# Patient Record
Sex: Female | Born: 1935 | Race: White | Hispanic: No | Smoking: Never smoker
Health system: Southern US, Community
[De-identification: ages and names within clinical notes are randomized; demographics above are authoritative.]

## PROBLEM LIST (undated history)

## (undated) DIAGNOSIS — I471 Supraventricular tachycardia, unspecified: Secondary | ICD-10-CM

## (undated) DIAGNOSIS — I739 Peripheral vascular disease, unspecified: Secondary | ICD-10-CM

## (undated) DIAGNOSIS — I480 Paroxysmal atrial fibrillation: Secondary | ICD-10-CM

## (undated) DIAGNOSIS — E039 Hypothyroidism, unspecified: Secondary | ICD-10-CM

## (undated) DIAGNOSIS — F419 Anxiety disorder, unspecified: Secondary | ICD-10-CM

## (undated) DIAGNOSIS — I2510 Atherosclerotic heart disease of native coronary artery without angina pectoris: Secondary | ICD-10-CM

## (undated) DIAGNOSIS — M1990 Unspecified osteoarthritis, unspecified site: Secondary | ICD-10-CM

## (undated) DIAGNOSIS — I5032 Chronic diastolic (congestive) heart failure: Secondary | ICD-10-CM

## (undated) DIAGNOSIS — K589 Irritable bowel syndrome without diarrhea: Secondary | ICD-10-CM

## (undated) DIAGNOSIS — I491 Atrial premature depolarization: Secondary | ICD-10-CM

## (undated) DIAGNOSIS — I495 Sick sinus syndrome: Secondary | ICD-10-CM

## (undated) DIAGNOSIS — I10 Essential (primary) hypertension: Secondary | ICD-10-CM

## (undated) DIAGNOSIS — E785 Hyperlipidemia, unspecified: Secondary | ICD-10-CM

## (undated) HISTORY — PX: CORONARY ANGIOPLASTY WITH STENT PLACEMENT: SHX49

## (undated) HISTORY — PX: BACK SURGERY: SHX140

## (undated) HISTORY — PX: CHOLECYSTECTOMY: SHX55

## (undated) HISTORY — PX: ABDOMINAL HYSTERECTOMY: SHX81

## (undated) HISTORY — PX: THYROIDECTOMY: SHX17

## (undated) HISTORY — PX: LUMBAR DISC SURGERY: SHX700

## (undated) HISTORY — DX: Paroxysmal atrial fibrillation: I48.0

## (undated) HISTORY — PX: ANTERIOR FUSION CERVICAL SPINE: SUR626

## (undated) HISTORY — DX: Sick sinus syndrome: I49.5

---

## 2016-01-10 DIAGNOSIS — I2510 Atherosclerotic heart disease of native coronary artery without angina pectoris: Secondary | ICD-10-CM | POA: Insufficient documentation

## 2016-01-10 DIAGNOSIS — I493 Ventricular premature depolarization: Secondary | ICD-10-CM | POA: Insufficient documentation

## 2016-01-22 ENCOUNTER — Emergency Department
Admission: EM | Admit: 2016-01-22 | Discharge: 2016-01-23 | Disposition: A | Discharge status: Home / Self Care | Payer: Medicare PPO | Attending: Emergency Medicine | Admitting: Emergency Medicine

## 2016-01-22 ENCOUNTER — Emergency Department: Payer: Medicare PPO

## 2016-01-22 ENCOUNTER — Encounter: Payer: Self-pay | Admitting: Emergency Medicine

## 2016-01-22 DIAGNOSIS — E039 Hypothyroidism, unspecified: Secondary | ICD-10-CM | POA: Diagnosis not present

## 2016-01-22 DIAGNOSIS — I10 Essential (primary) hypertension: Secondary | ICD-10-CM | POA: Insufficient documentation

## 2016-01-22 DIAGNOSIS — I2510 Atherosclerotic heart disease of native coronary artery without angina pectoris: Secondary | ICD-10-CM | POA: Diagnosis not present

## 2016-01-22 DIAGNOSIS — R002 Palpitations: Secondary | ICD-10-CM | POA: Diagnosis not present

## 2016-01-22 DIAGNOSIS — R0789 Other chest pain: Secondary | ICD-10-CM

## 2016-01-22 HISTORY — DX: Supraventricular tachycardia, unspecified: I47.10

## 2016-01-22 HISTORY — DX: Supraventricular tachycardia: I47.1

## 2016-01-22 HISTORY — DX: Atherosclerotic heart disease of native coronary artery without angina pectoris: I25.10

## 2016-01-22 HISTORY — DX: Peripheral vascular disease, unspecified: I73.9

## 2016-01-22 HISTORY — DX: Unspecified osteoarthritis, unspecified site: M19.90

## 2016-01-22 HISTORY — DX: Hypothyroidism, unspecified: E03.9

## 2016-01-22 HISTORY — DX: Irritable bowel syndrome, unspecified: K58.9

## 2016-01-22 HISTORY — DX: Essential (primary) hypertension: I10

## 2016-01-22 HISTORY — DX: Hyperlipidemia, unspecified: E78.5

## 2016-01-22 LAB — CBC WITH DIFFERENTIAL/PLATELET
Basophils Absolute: 0 10*3/uL (ref 0–0.1)
Basophils Relative: 0 %
EOS PCT: 2 %
Eosinophils Absolute: 0.1 10*3/uL (ref 0–0.7)
HEMATOCRIT: 37 % (ref 35.0–47.0)
Hemoglobin: 12.6 g/dL (ref 12.0–16.0)
LYMPHS ABS: 1.6 10*3/uL (ref 1.0–3.6)
LYMPHS PCT: 28 %
MCH: 30.4 pg (ref 26.0–34.0)
MCHC: 34.1 g/dL (ref 32.0–36.0)
MCV: 89.2 fL (ref 80.0–100.0)
MONO ABS: 0.5 10*3/uL (ref 0.2–0.9)
Monocytes Relative: 9 %
NEUTROS ABS: 3.5 10*3/uL (ref 1.4–6.5)
Neutrophils Relative %: 61 %
PLATELETS: 211 10*3/uL (ref 150–440)
RBC: 4.15 MIL/uL (ref 3.80–5.20)
RDW: 13.6 % (ref 11.5–14.5)
WBC: 5.7 10*3/uL (ref 3.6–11.0)

## 2016-01-22 LAB — COMPREHENSIVE METABOLIC PANEL
ALT: 11 U/L — AB (ref 14–54)
AST: 16 U/L (ref 15–41)
Albumin: 3.9 g/dL (ref 3.5–5.0)
Alkaline Phosphatase: 94 U/L (ref 38–126)
Anion gap: 6 (ref 5–15)
BILIRUBIN TOTAL: 0.5 mg/dL (ref 0.3–1.2)
BUN: 15 mg/dL (ref 6–20)
CHLORIDE: 110 mmol/L (ref 101–111)
CO2: 24 mmol/L (ref 22–32)
CREATININE: 0.88 mg/dL (ref 0.44–1.00)
Calcium: 8.7 mg/dL — ABNORMAL LOW (ref 8.9–10.3)
Glucose, Bld: 203 mg/dL — ABNORMAL HIGH (ref 65–99)
POTASSIUM: 3.5 mmol/L (ref 3.5–5.1)
Sodium: 140 mmol/L (ref 135–145)
TOTAL PROTEIN: 6.6 g/dL (ref 6.5–8.1)

## 2016-01-22 LAB — TROPONIN I: Troponin I: <0.03 ng/mL (ref <0.03)

## 2016-01-22 LAB — MAGNESIUM: MAGNESIUM: 1.9 mg/dL (ref 1.7–2.4)

## 2016-01-22 NOTE — ED Notes (Signed)
Patient @ XR 

## 2016-01-22 NOTE — ED Notes (Signed)
Changed out patient's O2 sensor, it was giving false data.

## 2016-01-22 NOTE — ED Provider Notes (Addendum)
[facility] Emergency Department Provider Note  ____________________________________________   First MD Initiated Contact with Patient 01/22/16 1939     (approximate)  I have reviewed the triage vital signs and the nursing notes.   HISTORY  Chief Complaint Chest Pain    HPI Elson AreasMary L Harari is a 82 y.o. female who is from [place] and was seen 12 days ago by her cardiologist, Dr. Ronny FlurryGery Tomassoni, and started on sotalol for intermittent symptomatic SVT/palpitations.  She is now visiting [place] through the end of the year and staying with her daughter.  She had an episode tonight that feels similar to the episode she has been having for 5-6 months and include acute onset fluttering in her chest, lightheadedness, dizziness, nausea, and vomiting.  Symptoms are severe when they first happened and then resolved.  She states that there is no difference between this one and her symptoms over the last 5-6 months.  She reports that they had spoken by phone with her cardiologist office yesterday who requested that the patient obtain an EKG and have it faxed to him.  They were planning to come to this hospital to try to have an EKG performed as an outpatient when she had her acute episode while at dinner so they came in and checked into the emergency department.  She currently feels better and has no active symptoms at this time.  She reports that she had a cardiac catheterization about a month ago which did not require any stents or intervention although she has 2 stents from prior catheterizations.  She takes Plavix every day and did have her dose today.  She denies recent illness, abdominal pain, and any active chest pain.  Nothing in particular makes her symptoms better or worse.   Past Medical History:  Diagnosis Date  . CAD (coronary artery disease)    stents to proximal LAD and mid RCA  . Hyperlipidemia   . Hypertension   . Hypothyroidism   . IBS (irritable bowel  syndrome)   . Osteoarthritis   . PVD (peripheral vascular disease) (HCC)   . SVT (supraventricular tachycardia) (HCC)     There are no active problems to display for this patient.   Past Surgical History:  Procedure Laterality Date  . ABDOMINAL HYSTERECTOMY    . BACK SURGERY    . CHOLECYSTECTOMY    . THYROIDECTOMY Left     Prior to Admission medications   Not on File    Allergies Penicillins and Sulfa antibiotics  History reviewed. No pertinent family history.  Social History Social History  Substance Use Topics  . Smoking status: Never Smoker  . Smokeless tobacco: Never Used  . Alcohol use No    Review of Systems Constitutional: No fever/chills Eyes: No visual changes. ENT: No sore throat. Cardiovascular: Episodic palpitations and chest discomfort Respiratory: Denies shortness of breath. Gastrointestinal: No abdominal pain.  No nausea, no vomiting.  No diarrhea.  No constipation. Genitourinary: Negative for dysuria. Musculoskeletal: Negative for back pain. Skin: Negative for rash. Neurological: Negative for headaches, focal weakness or numbness.  10-point ROS otherwise negative.  ____________________________________________   PHYSICAL EXAM:  VITAL SIGNS: ED Triage Vitals  Enc Vitals Group     BP 01/22/16 1902 (!) 178/78     Pulse Rate 01/22/16 1902 (!) 54     Resp 01/22/16 1902 16     Temp 01/22/16 1902 97.3 F (36.3 C)     Temp Source 01/22/16 1902 Oral  SpO2 01/22/16 1902 97 %     Weight 01/22/16 1901 158 lb (71.7 kg)     Height 01/22/16 1901 5\' 4"  (1.626 m)     Head Circumference --      Peak Flow --      Pain Score 01/22/16 1912 8     Pain Loc --      Pain Edu? --      Excl. in GC? --     Constitutional: Alert and oriented. Well appearing and in no acute distress. Eyes: Conjunctivae are normal. PERRL. EOMI. Head: Atraumatic. Nose: No congestion/rhinnorhea. Mouth/Throat: Mucous membranes are moist.  Oropharynx  non-erythematous. Neck: No stridor.  No meningeal signs.   Cardiovascular: Bradycardia, regular rhythm. Good peripheral circulation. Grossly normal heart sounds. Respiratory: Normal respiratory effort.  No retractions. Lungs CTAB. Gastrointestinal: Soft and nontender. No distention.  Musculoskeletal: No lower extremity tenderness nor edema. No gross deformities of extremities. Neurologic:  Normal speech and language. No gross focal neurologic deficits are appreciated.  Skin:  Skin is warm, dry and intact. No rash noted. Psychiatric: Mood and affect are normal. Speech and behavior are normal.  ____________________________________________   LABS (all labs ordered are listed, but only abnormal results are displayed)  Labs Reviewed  COMPREHENSIVE METABOLIC PANEL - Abnormal; Notable for the following:       Result Value   Glucose, Bld 203 (*)    Calcium 8.7 (*)    ALT 11 (*)    All other components within normal limits  CBC WITH DIFFERENTIAL/PLATELET  TROPONIN I  MAGNESIUM  TROPONIN I   ____________________________________________  EKG  ED ECG REPORT #1 I, Jozsef Wescoat, the attending physician, personally viewed and interpreted this ECG.  Date: 01/22/2016 EKG Time: 18:59 Rate: 52 Rhythm: Sinus bradycardia QRS Axis: normal Intervals: normal ST/T Wave abnormalities: Non-specific ST segment / T-wave changes, but no evidence of acute ischemia. Conduction Disturbances: none Narrative Interpretation: unremarkable  ED ECG REPORT #2 I, Corrissa Martello, the attending physician, personally viewed and interpreted this ECG.  Date: 01/22/2016 EKG Time: 20:56 Rate: 52 Rhythm: sinus bradycardia QRS Axis: normal Intervals: normal ST/T Wave abnormalities: Inverted T waves in leads V2 and V3, no reciprocal changes, no ST segment elevation or depression Conduction Disturbances: none Narrative Interpretation:  unremarkable  ____________________________________________  RADIOLOGY   Dg Chest 2 View  Result Date: 01/22/2016 CLINICAL DATA:  Chest pain EXAM: CHEST  2 VIEW COMPARISON:  None. FINDINGS: The heart is moderately enlarged. Linear atelectasis for scar in the right middle lobe and lingula. Normal vascularity. Moderate size hiatal hernia contains an air-fluid level. Increased AP diameter of the chest and interstitial prominence are likely related to COPD. No pneumothorax or pleural effusion. IMPRESSION: No active cardiopulmonary disease.  Chronic changes. Electronically Signed   By: Jolaine Click M.D.   On: 01/22/2016 19:44    ____________________________________________   PROCEDURES  Procedure(s) performed:   Procedures   Critical Care performed: No ____________________________________________   INITIAL IMPRESSION / ASSESSMENT AND PLAN / ED COURSE  Pertinent labs & imaging results that were available during my care of the patient were reviewed by me and considered in my medical decision making (see chart for details).  The patient [place] cardiology [place] of [place] but she will not be returning to [place] for more than a month.  However she is in close contact with her cardiologist.  As per his request I faxed him a copy of her EKG.  I discussed the plan with the  patient and her daughter and our current plan is to check 2 sets of enzymes and discharge for outpatient follow-up if she remains stable and asymptomatic.  Given that she has had a catheterization within the last month which was reassuring, she recently started on sotalol, and she is in telephone contact with her cardiologist, I feel this is safe and acceptable and she can come back to the emergency department with new or worsening symptoms.  She and her daughter agree with this plan.  Transferring ED care to Dr. Sharma CovertNorman to follow up repeat troponin and  reassess.   ____________________________________________  FINAL CLINICAL IMPRESSION(S) / ED DIAGNOSES  Final diagnoses:  Palpitations  Atypical chest pain     MEDICATIONS GIVEN DURING THIS VISIT:  Medications - No data to display   NEW OUTPATIENT MEDICATIONS STARTED DURING THIS VISIT:  New Prescriptions   No medications on file    Modified Medications   No medications on file    Discontinued Medications   No medications on file     Note:  This document was prepared using Dragon voice recognition software and may include unintentional dictation errors.    Loleta Roseory Assia Meanor, MD 01/22/16 2056    Loleta Roseory Kimyata Milich, MD 01/22/16 [id number]    Loleta Roseory Sabine Tenenbaum, MD 01/22/16 2106

## 2016-01-22 NOTE — ED Notes (Signed)
ED Provider at bedside. 

## 2016-01-22 NOTE — ED Triage Notes (Signed)
Pt to triage via w/c with no distress noted; reports mid CP, nonradiating accomp by weakness; st onset while eating dinner; hx of same

## 2016-01-22 NOTE — Discharge Instructions (Signed)

## 2016-10-25 HISTORY — PX: OTHER SURGICAL HISTORY: SHX169

## 2016-11-06 HISTORY — PX: OTHER SURGICAL HISTORY: SHX169

## 2018-01-17 IMAGING — CR DG CHEST 2V
1 series · 2 of 2 positions shown · non-contrast
Comparison: None.

CLINICAL DATA: Chest pain

EXAM:
CHEST  2 VIEW

[Series 1: dg chest 2 view · 0.14mm/px · 2 of 2 slices shown]
[im 1/2]
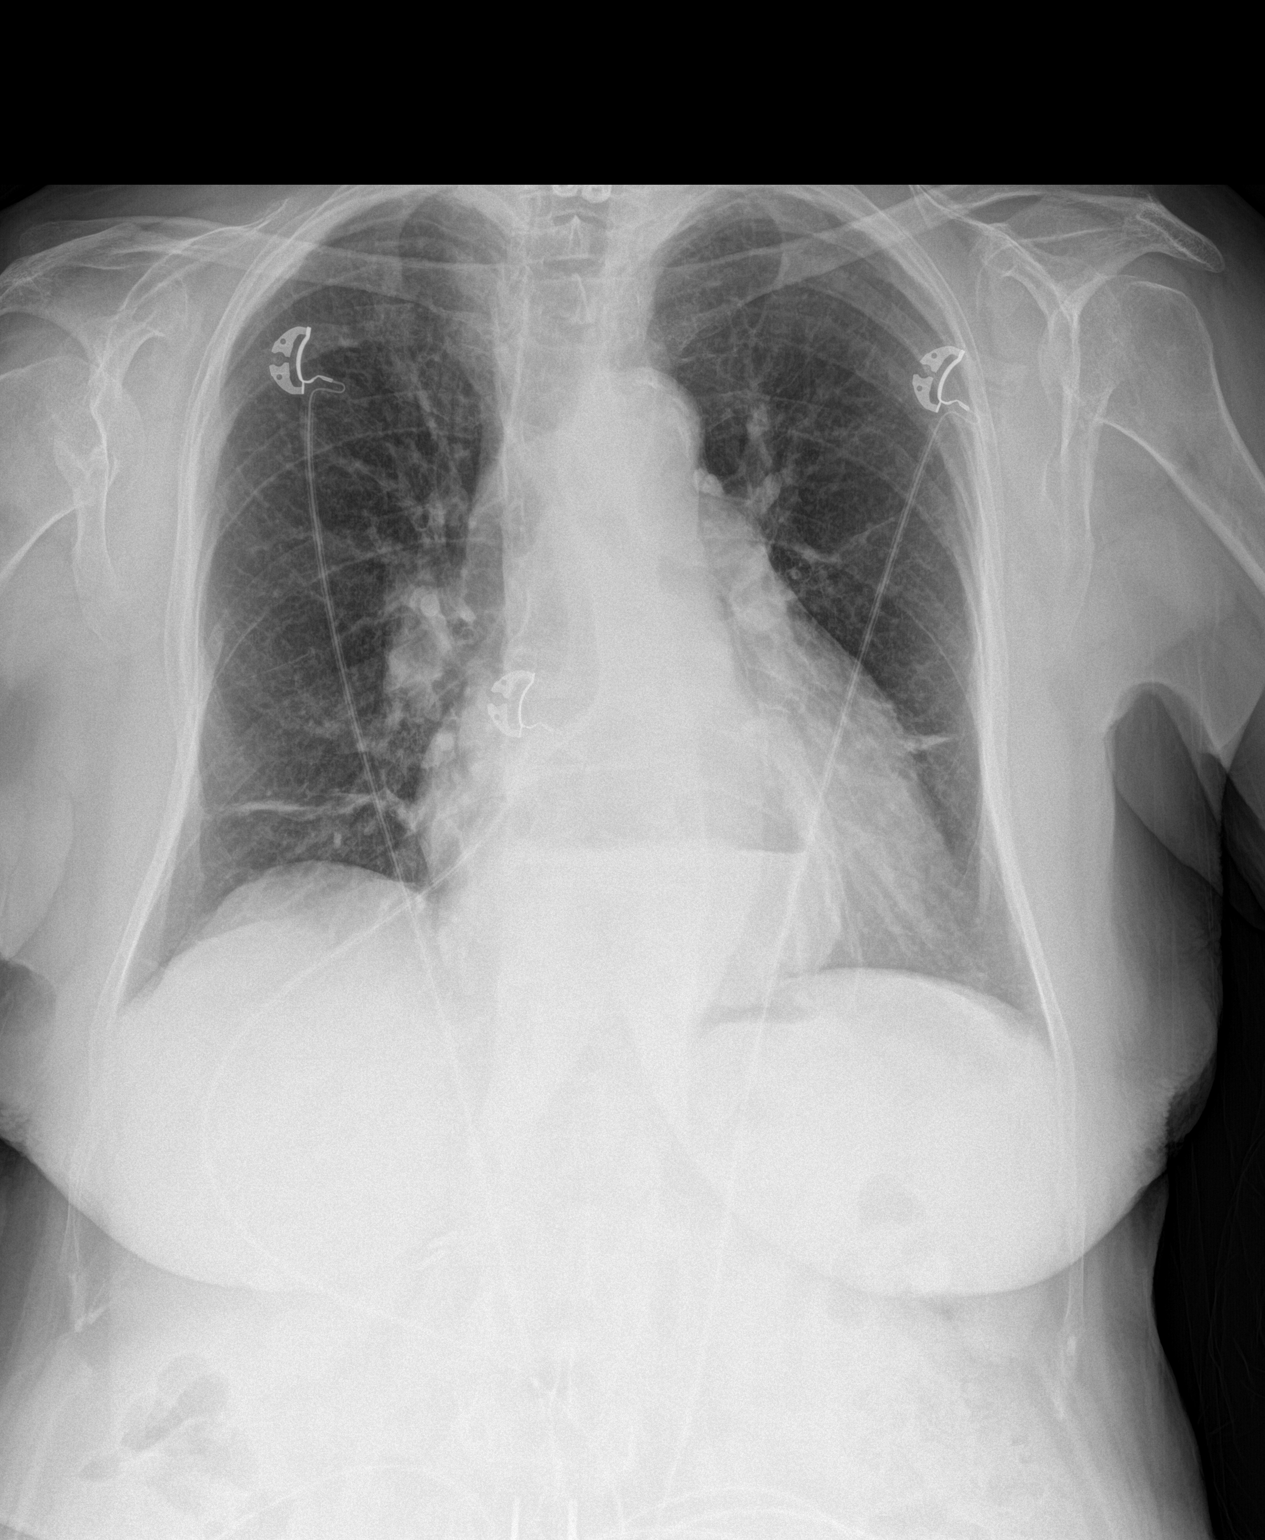
[im 2/2]
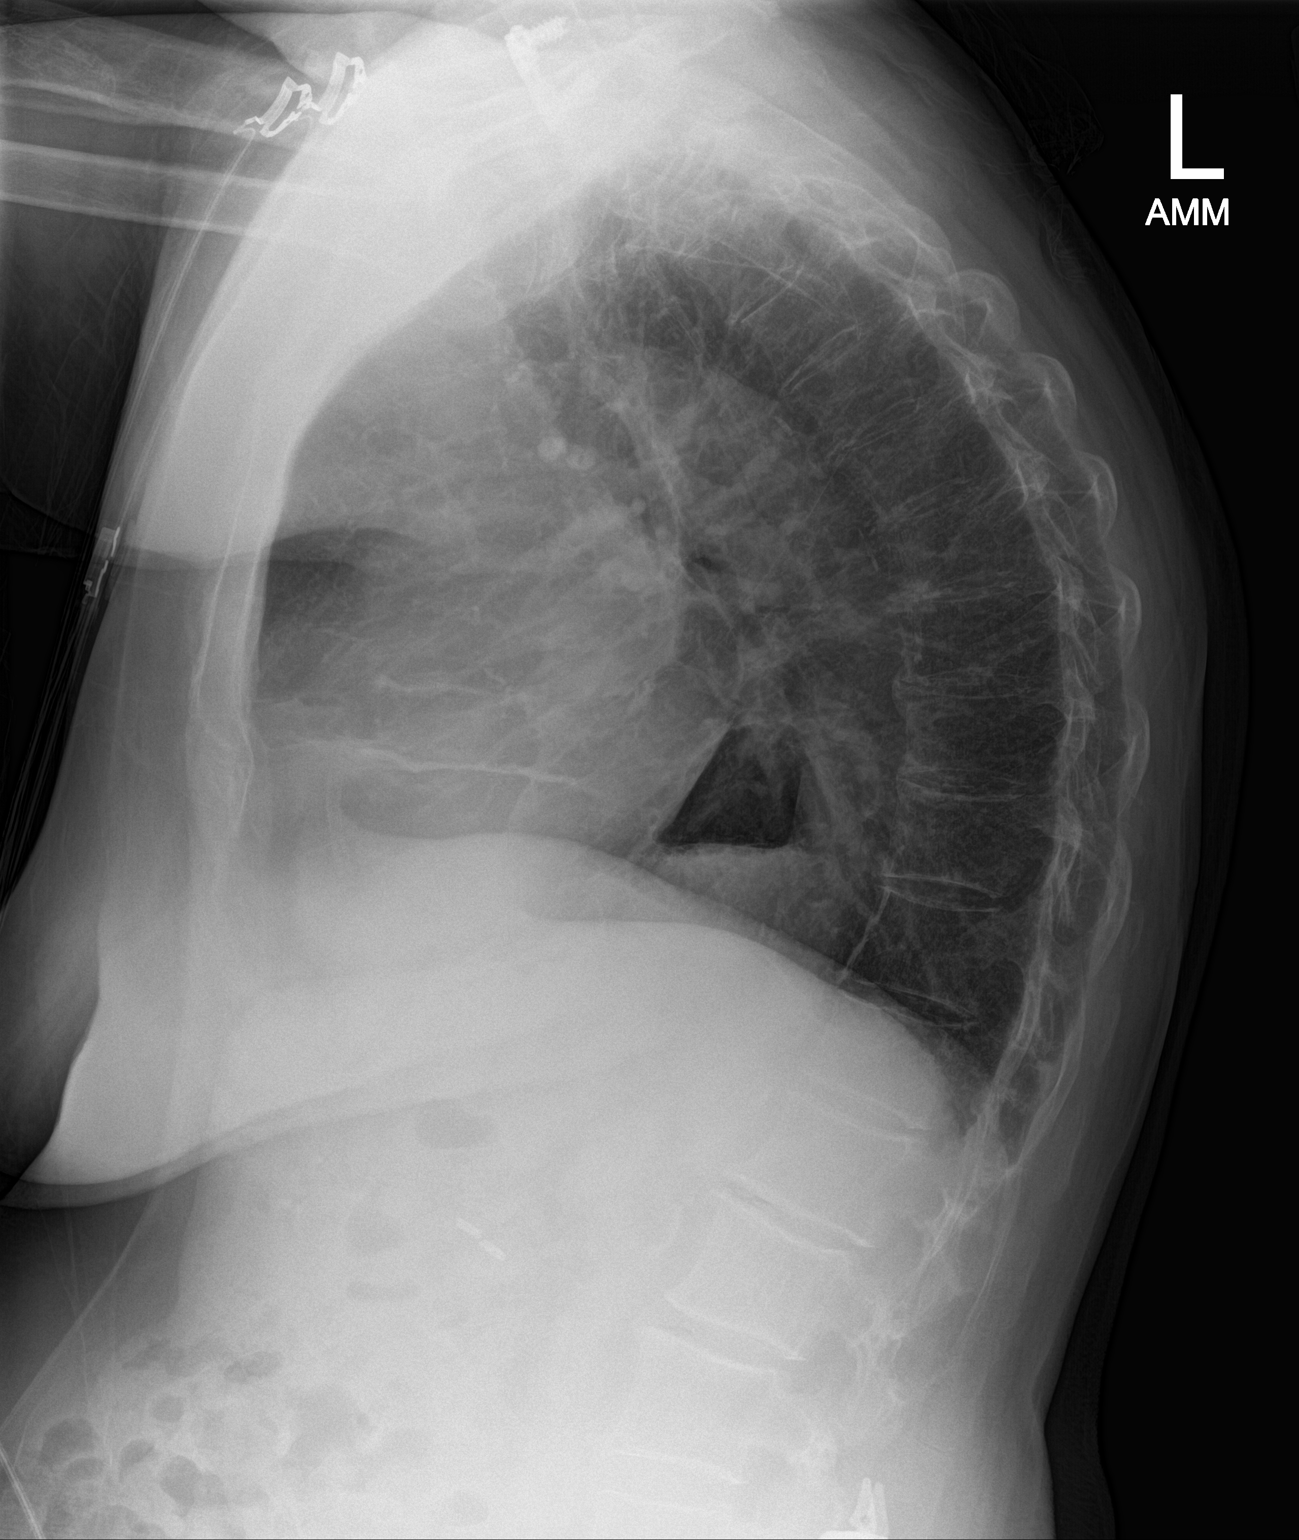

[2 of 2 positions shown; findings below may reference images not displayed]

FINDINGS: The heart is moderately enlarged. Linear atelectasis for scar in the
right middle lobe and lingula. Normal vascularity. Moderate size
hiatal hernia contains an air-fluid level. Increased AP diameter of
the chest and interstitial prominence are likely related to COPD. No
pneumothorax or pleural effusion.
IMPRESSION: No active cardiopulmonary disease.  Chronic changes.

## 2020-02-25 DIAGNOSIS — Z86718 Personal history of other venous thrombosis and embolism: Secondary | ICD-10-CM

## 2020-02-25 HISTORY — DX: Personal history of other venous thrombosis and embolism: Z86.718

## 2020-11-14 ENCOUNTER — Ambulatory Visit
Admission: EM | Admit: 2020-11-14 | Discharge: 2020-11-14 | Disposition: A | Discharge status: Other Acute Inpatient Hospital | Payer: Medicare Other | Attending: Physician Assistant | Admitting: Physician Assistant

## 2020-11-14 ENCOUNTER — Other Ambulatory Visit: Payer: Self-pay

## 2020-11-14 ENCOUNTER — Ambulatory Visit (INDEPENDENT_AMBULATORY_CARE_PROVIDER_SITE_OTHER): Payer: Medicare Other

## 2020-11-14 DIAGNOSIS — R0902 Hypoxemia: Secondary | ICD-10-CM | POA: Insufficient documentation

## 2020-11-14 DIAGNOSIS — R059 Cough, unspecified: Secondary | ICD-10-CM

## 2020-11-14 DIAGNOSIS — U071 COVID-19: Secondary | ICD-10-CM | POA: Insufficient documentation

## 2020-11-14 DIAGNOSIS — R079 Chest pain, unspecified: Secondary | ICD-10-CM | POA: Insufficient documentation

## 2020-11-14 DIAGNOSIS — R9431 Abnormal electrocardiogram [ECG] [EKG]: Secondary | ICD-10-CM | POA: Insufficient documentation

## 2020-11-14 DIAGNOSIS — J45909 Unspecified asthma, uncomplicated: Secondary | ICD-10-CM | POA: Insufficient documentation

## 2020-11-14 LAB — RESP PANEL BY RT-PCR (FLU A&B, COVID) ARPGX2
Influenza A by PCR: NEGATIVE
Influenza B by PCR: NEGATIVE
SARS Coronavirus 2 by RT PCR: POSITIVE — AB

## 2020-11-14 NOTE — Discharge Instructions (Addendum)
-  The COVID test is positive.  Given your history and the fact that your oxygen is low at 91% I advised the go to the emergency department at this time for breathing treatments, corticosteroids, lab work-up and possible observation.  You have been advised to follow up immediately in the emergency department for concerning signs.symptoms. If you declined EMS transport, please have a family member take you directly to the ED at this time. Do not delay. Based on concerns about condition, if you do not follow up in th e ED, you may risk poor outcomes including worsening of condition, delayed treatment and potentially life threatening issues. If you have declined to go to the ED at this time, you should call your PCP immediately to set up a follow up appointment.  Go to ED for red flag symptoms, including; fevers you cannot reduce with Tylenol/Motrin, severe headaches, vision changes, numbness/weakness in part of the body, lethargy, confusion, intractable vomiting, severe dehydration, chest pain, breathing difficulty, severe persistent abdominal or pelvic pain, signs of severe infection (increased redness, swelling of an area), feeling faint or passing out, dizziness, etc. You should especially go to the ED for sudden acute worsening of condition if you do not elect to go at this time.

## 2020-11-14 NOTE — ED Provider Notes (Signed)
[facility]    CSN: [id number] Arrival date & time: 11/14/20  1059      History   Chief Complaint Chief Complaint  Patient presents with   Cough    HPI Sandra Murphy is a 87 y.o. female presenting with her daughter for 2-day history of fatigue, cough and congestion as well as increased shortness of breath.  His daughter has been ill with similar symptoms.  Patient's daughter had a negative at home COVID test.  Patient has not tested for COVID.  She is vaccinated for COVID-19 x4.  She does have a history of asthma, but does not have an asthma inhaler to use.  Patient has not had any fevers.  She says her chest does hurt when she coughs.  She has not had any vomiting or diarrhea.  Patient has history of hypertension, hyperlipidemia, CAD and pacemaker.  Patient also has history of PACs and PVCs as well as SVT.  Patient has not been taking any decongestants.  She has no other complaints.  HPI  Past Medical History:  Diagnosis Date   CAD (coronary artery disease)    stents to proximal LAD and mid RCA   Hyperlipidemia    Hypertension    Hypothyroidism    IBS (irritable bowel syndrome)    Osteoarthritis    PVD (peripheral vascular disease) (HCC)    SVT (supraventricular tachycardia) (HCC)     There are no problems to display for this patient.   Past Surgical History:  Procedure Laterality Date   ABDOMINAL HYSTERECTOMY     BACK SURGERY     CHOLECYSTECTOMY     THYROIDECTOMY Left     OB History   No obstetric history on file.      Home Medications    Prior to Admission medications   Medication Sig Start Date End Date Taking? Authorizing Provider  cefdinir (OMNICEF) 300 MG capsule Take 1 capsule by mouth 2 (two) times daily. 01/14/16  Yes [provider]  clopidogrel (PLAVIX) 75 MG tablet Take 1 tablet by mouth daily. 01/16/16  Yes [provider]  isosorbide mononitrate (IMDUR) 60 MG 24 hr tablet Take 1 tablet by mouth daily. 01/09/16  Yes  [provider]  levothyroxine (SYNTHROID, LEVOTHROID) 50 MCG tablet Take 1 tablet by mouth daily. 12/07/15  Yes [provider]  LORazepam (ATIVAN) 0.5 MG tablet Take 1 tablet by mouth 2 (two) times daily. 01/12/16  Yes [provider]  losartan (COZAAR) 100 MG tablet Take 1 tablet by mouth daily. 01/03/16  Yes [provider]  mirtazapine (REMERON) 30 MG tablet Take 1 tablet by mouth daily. 11/20/15  Yes [provider]  oxybutynin (DITROPAN-XL) 10 MG 24 hr tablet Take 1 tablet by mouth daily. 11/23/15  Yes [provider]  pantoprazole (PROTONIX) 40 MG tablet Take 1 tablet by mouth 2 (two) times daily. 01/01/16  Yes [provider]  PHENADOZ 25 MG suppository Place 1 suppository rectally as needed. 01/18/16  Yes [provider]  pravastatin (PRAVACHOL) 40 MG tablet Take 1 tablet by mouth daily. 11/23/15  Yes [provider]  promethazine (PHENERGAN) 25 MG tablet Take 1 tablet by mouth 3 (three) times daily. 01/18/16  Yes [provider]  SOTALOL AF 80 MG TABS Take 1 tablet by mouth daily. 01/10/16  Yes [provider]  traMADol (ULTRAM) 50 MG tablet Take 1 tablet by mouth 3 (three) times daily. 01/18/16  Yes [provider]  levothyroxine (SYNTHROID) 50 MCG tablet  Take by mouth.    [provider]    Family History History reviewed. No pertinent family history.  Social History Social History   Tobacco Use   Smoking status: Never   Smokeless tobacco: Never  Vaping Use   Vaping Use: Never used  Substance Use Topics   Alcohol use: No   Drug use: Never     Allergies   Penicillins and Sulfa antibiotics   Review of Systems Review of Systems  Constitutional:  Positive for fatigue. Negative for chills, diaphoresis and fever.  HENT:  Positive for congestion and rhinorrhea. Negative for ear pain, sinus pressure, sinus pain and sore throat.   Respiratory:  Positive for cough  and shortness of breath.   Cardiovascular:  Positive for chest pain.  Gastrointestinal:  Negative for abdominal pain, nausea and vomiting.  Musculoskeletal:  Negative for arthralgias and myalgias.  Skin:  Negative for rash.  Neurological:  Negative for weakness and headaches.  Hematological:  Negative for adenopathy.    Physical Exam Triage Vital Signs ED Triage Vitals  Enc Vitals Group     BP 11/14/20 1150 131/82     Pulse Rate 11/14/20 1150 73     Resp 11/14/20 1150 20     Temp 11/14/20 1150 99.8 F (37.7 C)     Temp Source 11/14/20 1150 Oral     SpO2 11/14/20 1150 94 %     Weight 11/14/20 1144 171 lb (77.6 kg)     Height 11/14/20 1144 5\' 4"  (1.626 m)     Head Circumference --      Peak Flow --      Pain Score 11/14/20 1144 0     Pain Loc --      Pain Edu? --      Excl. in GC? --    No data found.  Updated Vital Signs BP 131/82 (BP Location: Left Arm)   Pulse 73   Temp 99.8 F (37.7 C) (Oral)   Resp 20   Ht 5\' 4"  (1.626 m)   Wt 171 lb (77.6 kg)   SpO2 91%   BMI 29.35 kg/m      Physical Exam Vitals and nursing note reviewed.  Constitutional:      General: She is not in acute distress.    Appearance: Normal appearance. She is ill-appearing. She is not toxic-appearing.  HENT:     Head: Normocephalic and atraumatic.     Right Ear: Tympanic membrane, ear canal and external ear normal.     Left Ear: Tympanic membrane, ear canal and external ear normal.     Nose: Congestion and rhinorrhea present.     Mouth/Throat:     Mouth: Mucous membranes are moist.     Pharynx: Oropharynx is clear.  Eyes:     General: No scleral icterus.       Right eye: No discharge.        Left eye: No discharge.     Conjunctiva/sclera: Conjunctivae normal.  Cardiovascular:     Rate and Rhythm: Normal rate. Rhythm irregular.     Heart sounds: Normal heart sounds.  Pulmonary:     Effort: Pulmonary effort is normal. No respiratory distress.     Breath sounds: Wheezing (few scattered  wheezes throughout) present.  Musculoskeletal:     Cervical back: Neck supple.  Skin:    General: Skin is dry.  Neurological:     General: No focal deficit present.     Mental Status: She is alert. Mental  status is at baseline.     Motor: No weakness.     Gait: Gait normal.  Psychiatric:        Mood and Affect: Mood normal.        Behavior: Behavior normal.        Thought Content: Thought content normal.     UC Treatments / Results  Labs (all labs ordered are listed, but only abnormal results are displayed) Labs Reviewed  RESP PANEL BY RT-PCR (FLU A&B, COVID) ARPGX2 - Abnormal; Notable for the following components:      Result Value   SARS Coronavirus 2 by RT PCR POSITIVE (*)    All other components within normal limits    EKG   Radiology DG Chest 2 View  Result Date: 11/14/2020 CLINICAL DATA:  Cough, congestion and chest discomfort in an 87 year old female. EXAM: CHEST - 2 VIEW COMPARISON:  January 22, 2016.  No additional comparison imaging. FINDINGS: LEFT-sided dual lead pacer device in place, power pack over the LEFT chest. Leads project over the cardiac silhouette in the area of RIGHT atrium and RIGHT ventricle. Cardiomediastinal contours with stable cardiac enlargement and fullness of RIGHT and LEFT hilum, not substantially changed from previous imaging. No lobar consolidative process. No sign of pneumothorax. Flattening of RIGHT and LEFT hemidiaphragm, no effusion. Signs of linear scarring in the LEFT mid chest is similar to previous imaging. Hiatal hernia. Osteopenia without acute or destructive bone finding. IMPRESSION: No acute cardiopulmonary process with cardiomegaly and pacer device in place. Chronic changes as described. Moderately large hiatal hernia. Electronically Signed   By: Donzetta Kohut M.D.   On: 11/14/2020 12:47    Procedures ED EKG  Date/Time: 11/14/2020 1:10 PM Performed by: Shirlee Latch, PA-C Authorized by: Shirlee Latch, PA-C   ECG reviewed  by ED Physician in the absence of a cardiologist: yes   Previous ECG:    Previous ECG:  Unavailable Interpretation:    Interpretation: abnormal   Rate:    ECG rate:  83   ECG rate assessment: normal   Rhythm:    Rhythm: sinus rhythm   Ectopy:    Ectopy: PAC   QRS:    QRS axis:  Normal   QRS intervals:  Normal   QRS conduction: normal   T waves:    T waves: inverted     Inverted:  V4, V5 and V6 Comments:     NSR with PACs, T wave depression V4, V5, V6 (including critical care time)  Medications Ordered in UC Medications - No data to display  Initial Impression / Assessment and Plan / UC Course  I have reviewed the triage vital signs and the nursing notes.  Pertinent labs & imaging results that were available during my care of the patient were reviewed by me and considered in my medical decision making (see chart for details).  87 year old female presenting for 2-day history of fatigue, cough, congestion, chest pain with coughing and shortness of breath.  Patient has been vaccinated for COVID-19 x4.  She has complicated medical history with hypertension, hyperlipidemia, CAD and has a current pacemaker on Plavix.  Vitals all stable and normal.  Patient is ill-appearing but nontoxic.  Exam significant for nasal congestion and few scattered wheezes throughout chest.  Chest  Respiratory panel is positive for COVID-19.  Chest x-ray independently viewed by me.  Chest x-ray interpreted as no acute abnormality.  I did also look at the chest x-ray and compared to previous 1 from 2017.  No obvious changes noted.  EKG obtained today due to complaint of chest pain and the fact that she has a pacemaker.  EKG shows normal sinus rhythm with PACs and T wave depression in V4 through V6.  Unable to review previous EKGs.  She does have notable history for PACs and PVCs. Unsure if T wave changes are new so patient definitely needs cardiac workup and monitoring.  Patient's oxygen is initially 86%  but does increase to 94% when she is resting.  Recheck of patient's oxygen after about an hour is 91% when she is resting comfortably in the exam room. Patient is not in any distress. Reviewed with patient that her COVID test is positive and I am concerned since her oxygen is low at 91% and she has a history of asthma.  I have advised that she seek further evaluation and treatment in the emergency department at this time.  They elected to go via private vehicle to [facility] ED in [place].  Patient's daughter is taking her.  Patient leaving in stable condition at this time.  Final Clinical Impressions(s) / UC Diagnoses   Final diagnoses:  COVID-19  Cough  Hypoxia  Nonspecific abnormal electrocardiogram (ECG) (EKG)     Discharge Instructions      -The COVID test is positive.  Given your history and the fact that your oxygen is low at 91% I advised the go to the emergency department at this time for breathing treatments, corticosteroids, lab work-up and possible observation.  You have been advised to follow up immediately in the emergency department for concerning signs.symptoms. If you declined EMS transport, please have a family member take you directly to the ED at this time. Do not delay. Based on concerns about condition, if you do not follow up in th e ED, you may risk poor outcomes including worsening of condition, delayed treatment and potentially life threatening issues. If you have declined to go to the ED at this time, you should call your PCP immediately to set up a follow up appointment.  Go to ED for red flag symptoms, including; fevers you cannot reduce with Tylenol/Motrin, severe headaches, vision changes, numbness/weakness in part of the body, lethargy, confusion, intractable vomiting, severe dehydration, chest pain, breathing difficulty, severe persistent abdominal or pelvic pain, signs of severe infection (increased redness, swelling of an area), feeling faint or passing out,  dizziness, etc. You should especially go to the ED for sudden acute worsening of condition if you do not elect to go at this time.      ED Prescriptions   None    PDMP not reviewed this encounter.   Shirlee Latch, PA-C 11/14/20 1327

## 2020-11-14 NOTE — ED Triage Notes (Signed)
Patient c/o cough and states it won't go away started Monday. Reports she is sore in her chest since she's been coughing so much.

## 2021-01-16 ENCOUNTER — Ambulatory Visit: Payer: Medicare PPO | Admitting: Internal Medicine

## 2021-08-08 DIAGNOSIS — Z955 Presence of coronary angioplasty implant and graft: Secondary | ICD-10-CM | POA: Insufficient documentation

## 2021-08-08 DIAGNOSIS — Z86718 Personal history of other venous thrombosis and embolism: Secondary | ICD-10-CM | POA: Insufficient documentation

## 2021-08-08 DIAGNOSIS — E782 Mixed hyperlipidemia: Secondary | ICD-10-CM | POA: Insufficient documentation

## 2021-08-08 DIAGNOSIS — M810 Age-related osteoporosis without current pathological fracture: Secondary | ICD-10-CM | POA: Insufficient documentation

## 2021-08-08 DIAGNOSIS — I739 Peripheral vascular disease, unspecified: Secondary | ICD-10-CM | POA: Insufficient documentation

## 2021-08-08 DIAGNOSIS — K449 Diaphragmatic hernia without obstruction or gangrene: Secondary | ICD-10-CM | POA: Insufficient documentation

## 2021-11-04 DIAGNOSIS — Z950 Presence of cardiac pacemaker: Secondary | ICD-10-CM | POA: Diagnosis not present

## 2021-12-04 ENCOUNTER — Encounter: Payer: Self-pay | Admitting: Internal Medicine

## 2021-12-04 ENCOUNTER — Ambulatory Visit (INDEPENDENT_AMBULATORY_CARE_PROVIDER_SITE_OTHER): Payer: Medicare HMO | Admitting: Internal Medicine

## 2021-12-04 VITALS — BP 126/80 | HR 60 | Ht 64.0 in | Wt 175.0 lb

## 2021-12-04 DIAGNOSIS — M810 Age-related osteoporosis without current pathological fracture: Secondary | ICD-10-CM | POA: Diagnosis not present

## 2021-12-04 DIAGNOSIS — N3281 Overactive bladder: Secondary | ICD-10-CM | POA: Diagnosis not present

## 2021-12-04 DIAGNOSIS — I739 Peripheral vascular disease, unspecified: Secondary | ICD-10-CM

## 2021-12-04 DIAGNOSIS — M5136 Other intervertebral disc degeneration, lumbar region: Secondary | ICD-10-CM

## 2021-12-04 DIAGNOSIS — I2510 Atherosclerotic heart disease of native coronary artery without angina pectoris: Secondary | ICD-10-CM

## 2021-12-04 DIAGNOSIS — E039 Hypothyroidism, unspecified: Secondary | ICD-10-CM

## 2021-12-04 DIAGNOSIS — N6310 Unspecified lump in the right breast, unspecified quadrant: Secondary | ICD-10-CM

## 2021-12-04 DIAGNOSIS — F411 Generalized anxiety disorder: Secondary | ICD-10-CM | POA: Insufficient documentation

## 2021-12-04 DIAGNOSIS — I495 Sick sinus syndrome: Secondary | ICD-10-CM

## 2021-12-04 DIAGNOSIS — K219 Gastro-esophageal reflux disease without esophagitis: Secondary | ICD-10-CM

## 2021-12-04 DIAGNOSIS — M51369 Other intervertebral disc degeneration, lumbar region without mention of lumbar back pain or lower extremity pain: Secondary | ICD-10-CM

## 2021-12-04 MED ORDER — SOTALOL HCL (AF) 80 MG PO TABS: 1.0000 | ORAL_TABLET | Freq: Two times a day (BID) | ORAL | 0 refills | Status: DC

## 2021-12-04 NOTE — Patient Instructions (Signed)
Call [facility] to schedule your mammogram at [phone].  

## 2021-12-04 NOTE — Progress Notes (Signed)
Date:  12/04/2021   Name:  Sandra Murphy   DOB:  07-18-1933   MRN:  [id number]   Chief Complaint: Establish Care and Breast Mass (2021- right breast, was not malignant tumor, no new changes, moves around /)  Back Pain This is a chronic problem. The problem occurs constantly. The pain is present in the lumbar spine. The pain does not radiate. The pain is mild. Pertinent negatives include no abdominal pain, chest pain, headaches or weakness. Treatments tried: hx of spinal stimulator for 3 yrs removed due to hardware issues.  Anxiety Presents for follow-up visit. Symptoms include nervous/anxious behavior (has generalized anxiety) and palpitations. Patient reports no chest pain, dizziness or shortness of breath. Symptoms occur most days. The quality of sleep is good.   Compliance with medications: takes low dose ativan twice a day.  Thyroid Problem Presents for follow-up visit. Symptoms include anxiety (has generalized anxiety) and palpitations. Patient reports no constipation, diarrhea or fatigue. (History of benign thyroid mass excision) The symptoms have been stable.   Breast mass on right - she has not really felt a mass but had a CT in July 2022 that showed a medial right breast mass.  Her last mammogram was about 15 yrs ago in [place].  No hx of mass, biopsy or trauma.  PCP in [place] was trying to get her to have a mammogram.  CAD - s/p pacemaker, PTCA and 2 stents.  Hx of PVCs and PACs.  Doing well currently.  Last ED visit in June to [facility] for mild fluid overload.  She needs to establish with a local cardiologist.   Lab Results  Component Value Date   NA 140 01/22/2016   K 3.5 01/22/2016   CO2 24 01/22/2016   GLUCOSE 203 (H) 01/22/2016   BUN 15 01/22/2016   CREATININE 0.88 01/22/2016   CALCIUM 8.7 (L) 01/22/2016   GFRNONAA >60 01/22/2016   No results found for: "CHOL", "HDL", "LDLCALC", "LDLDIRECT", "TRIG", "CHOLHDL" No results found for: "TSH" No results found for: "HGBA1C" Lab  Results  Component Value Date   WBC 5.7 01/22/2016   HGB 12.6 01/22/2016   HCT 37.0 01/22/2016   MCV 89.2 01/22/2016   PLT 211 01/22/2016   Lab Results  Component Value Date   ALT 11 (L) 01/22/2016   AST 16 01/22/2016   ALKPHOS 94 01/22/2016   BILITOT 0.5 01/22/2016   No results found for: "25OHVITD2", "25OHVITD3", "VD25OH"   Review of Systems  Constitutional:  Negative for chills, fatigue and unexpected weight change.  HENT:  Negative for nosebleeds.   Eyes:  Negative for visual disturbance.  Respiratory:  Negative for cough, chest tightness, shortness of breath and wheezing.   Cardiovascular:  Positive for palpitations. Negative for chest pain and leg swelling.  Gastrointestinal:  Negative for abdominal pain, constipation and diarrhea.  Musculoskeletal:  Positive for back pain.  Neurological:  Negative for dizziness, weakness, light-headedness and headaches.  Psychiatric/Behavioral:  Negative for sleep disturbance. The patient is nervous/anxious (has generalized anxiety).     Patient Active Problem List   Diagnosis Date Noted   Tachy-brady syndrome (HCC) 12/04/2021   GERD without esophagitis 12/04/2021   Generalized anxiety disorder 12/04/2021   Degenerative disc disease, lumbar 12/04/2021   OAB (overactive bladder) 12/04/2021   Acquired hypothyroidism 12/04/2021   Mixed hyperlipidemia 08/08/2021   Hx of deep venous thrombosis 08/08/2021   Osteoporosis 08/08/2021   PVD (peripheral vascular disease) (HCC) 08/08/2021   Hx of heart artery stent 08/08/2021  Hiatal hernia 08/08/2021   Asthma 11/14/2020   Atherosclerotic heart disease of native coronary artery without angina pectoris 01/10/2016   PVCs (premature ventricular contractions) 01/10/2016    Allergies  Allergen Reactions   Penicillins Hives   Sulfa Antibiotics Nausea Only, Other (See Comments) and Diarrhea    HEADACHE     Past Surgical History:  Procedure Laterality Date   ABDOMINAL HYSTERECTOMY      ANTERIOR FUSION CERVICAL SPINE     twice   CHOLECYSTECTOMY     CORONARY ANGIOPLASTY WITH STENT PLACEMENT     proximal LAD and mid RCA   LUMBAR DISC SURGERY     Pace maker  10/2016   THYROIDECTOMY Left     Social History   Tobacco Use   Smoking status: Never   Smokeless tobacco: Never  Vaping Use   Vaping Use: Never used  Substance Use Topics   Alcohol use: No   Drug use: Never     Medication list has been reviewed and updated.  Current Meds  Medication Sig   alendronate (FOSAMAX) 70 MG tablet Take 70 mg by mouth once a week.   Aspirin 81 MG CAPS Take 1 tablet by mouth daily.   clopidogrel (PLAVIX) 75 MG tablet Take by mouth.   Ergocalciferol (VITAMIN D2 PO) Take 1,250 mcg by mouth every 7 (seven) days.   levothyroxine (SYNTHROID) 50 MCG tablet Take by mouth.   LORazepam (ATIVAN) 0.5 MG tablet Take 1 tablet by mouth 2 (two) times daily.   mirtazapine (REMERON) 30 MG tablet Take 1 tablet by mouth daily.   NIFEdipine (PROCARDIA XL/NIFEDICAL XL) 60 MG 24 hr tablet Take 30 mg by mouth daily.   nitroGLYCERIN (NITROSTAT) 0.4 MG SL tablet Place under the tongue.   oxybutynin (DITROPAN-XL) 10 MG 24 hr tablet Take 1 tablet by mouth daily.   pantoprazole (PROTONIX) 40 MG tablet Take 1 tablet by mouth 2 (two) times daily.   pravastatin (PRAVACHOL) 40 MG tablet Take 1 tablet by mouth daily.   sacubitril-valsartan (ENTRESTO) 24-26 MG Take 1 tablet by mouth 2 (two) times daily.   [DISCONTINUED] SOTALOL AF 80 MG TABS Take 1 tablet by mouth daily.       12/04/2021    4:59 PM  GAD 7 : Generalized Anxiety Score  Nervous, Anxious, on Edge 0  Control/stop worrying 0  Worry too much - different things 0  Trouble relaxing 0  Restless 0  Easily annoyed or irritable 0  Afraid - awful might happen 0  Total GAD 7 Score 0  Anxiety Difficulty Not difficult at all       12/04/2021    4:58 PM  Depression screen PHQ 2/9  Decreased Interest 0  Down, Depressed, Hopeless 0  PHQ - 2 Score  0  Altered sleeping 0  Tired, decreased energy 0  Change in appetite 0  Feeling bad or failure about yourself  0  Trouble concentrating 0  Moving slowly or fidgety/restless 0  Suicidal thoughts 0  PHQ-9 Score 0  Difficult doing work/chores Not difficult at all    BP Readings from Last 3 Encounters:  12/04/21 126/80  11/14/20 131/82  01/23/16 (!) 153/57    Physical Exam Vitals and nursing note reviewed.  Constitutional:      General: She is not in acute distress.    Appearance: Normal appearance. She is well-developed.  HENT:     Head: Normocephalic and atraumatic.  Cardiovascular:     Rate and Rhythm: Normal rate and regular  rhythm.     Pulses: Normal pulses.  Pulmonary:     Effort: Pulmonary effort is normal. No respiratory distress.     Breath sounds: No wheezing or rhonchi.  Chest:  Breasts:    Right: No mass, nipple discharge, skin change or tenderness.     Left: No mass, nipple discharge, skin change or tenderness.  Musculoskeletal:     Cervical back: Normal range of motion.     Right lower leg: No edema.     Left lower leg: No edema.  Lymphadenopathy:     Cervical: No cervical adenopathy.  Skin:    General: Skin is warm and dry.     Findings: No rash.  Neurological:     General: No focal deficit present.     Mental Status: She is alert and oriented to person, place, and time.  Psychiatric:        Mood and Affect: Mood normal.        Behavior: Behavior normal.     Wt Readings from Last 3 Encounters:  12/04/21 175 lb (79.4 kg)  11/14/20 171 lb (77.6 kg)  01/22/16 158 lb (71.7 kg)    BP 126/80   Pulse 60   Wt 175 lb (79.4 kg)   SpO2 97%   BMI 30.04 kg/m   Assessment and Plan: 1. Atherosclerosis of native coronary artery of native heart without angina pectoris S/p PTCA and 2 stents S/p Pacemaker Needs to establish with Cardiology Sotalol refilled - Ambulatory referral to Cardiology  2. Mass of right breast, unspecified quadrant Needs  Diagnostic mammogram - MM DIAG BREAST TOMO BILATERAL - US BREAST LTD UNI LEFT INC AXILLA - US BREAST LTD UNI RIGHT INC AXILLA  3. Tachy-brady syndrome (HCC) S/p pacemaker - SOTALOL AF 80 MG TABS; Take 1 tablet (80 mg total) by mouth 2 (two) times daily.  Dispense: 180 tablet; Refill: 0 - Ambulatory referral to Cardiology  4. Acquired hypothyroidism Supplemented Will get labs next visit  5. GERD without esophagitis Symptoms well controlled on daily PPI No red flag signs such as weight loss, n/v, melena Will continue pantoprazole  6. Age-related osteoporosis without current pathological fracture On Fosamax and vitamin D Will order DEXA next visit  7. OAB (overactive bladder) Symptoms controlled on Ditropan  8. Degenerative disc disease, lumbar S/p discectomy and spinal stimulator Would like to see Ortho  - Ambulatory referral to Orthopedic Surgery  9. Generalized anxiety disorder Continue low dose Ativan bid.   Partially dictated using Editor, commissioning. Any errors are unintentional.  Halina Maidens, MD [facility]  12/04/2021

## 2021-12-04 NOTE — Progress Notes (Deleted)
Annual Physical Exam Visit  Patient Information:  Patient ID: Sandra Murphy, female DOB: Mar 22, 1933 Age: 88 y.o. MRN: [id number]   Subjective:   CC: Annual Physical Exam  HPI:  Sandra Murphy is here for their annual physical.  I reviewed the past medical history, family history, social history, surgical history, and allergies today and changes were made as necessary.  Please see the problem list section below for additional details.  Past Medical History: Past Medical History:  Diagnosis Date   CAD (coronary artery disease)    stents to proximal LAD and mid RCA   Hyperlipidemia    Hypertension    Hypothyroidism    IBS (irritable bowel syndrome)    Osteoarthritis    PVD (peripheral vascular disease) (HCC)    SVT (supraventricular tachycardia)    Past Surgical History: Past Surgical History:  Procedure Laterality Date   ABDOMINAL HYSTERECTOMY     BACK SURGERY     CHOLECYSTECTOMY     THYROIDECTOMY Left    Family History: History reviewed. No pertinent family history. Allergies: Allergies  Allergen Reactions   Penicillins    Sulfa Antibiotics    Health Maintenance: Health Maintenance  Topic Date Due   COVID-19 Vaccine (1) Never done   TETANUS/TDAP  Never done   Zoster Vaccines- Shingrix (1 of 2) Never done   Pneumonia Vaccine 53+ Years old (1 - PCV) Never done   DEXA SCAN  Never done   INFLUENZA VACCINE  Never done   HPV VACCINES  Aged Out    HM Colonoscopy     This patient has no relevant Health Maintenance data.      Medications: Current Outpatient Medications on File Prior to Visit  Medication Sig Dispense Refill   cefdinir (OMNICEF) 300 MG capsule Take 1 capsule by mouth 2 (two) times daily.     clopidogrel (PLAVIX) 75 MG tablet Take 1 tablet by mouth daily.     isosorbide mononitrate (IMDUR) 60 MG 24 hr tablet Take 1 tablet by mouth daily.     levothyroxine (SYNTHROID) 50 MCG tablet Take by mouth.     levothyroxine (SYNTHROID, LEVOTHROID) 50 MCG  tablet Take 1 tablet by mouth daily.     LORazepam (ATIVAN) 0.5 MG tablet Take 1 tablet by mouth 2 (two) times daily.     losartan (COZAAR) 100 MG tablet Take 1 tablet by mouth daily.     mirtazapine (REMERON) 30 MG tablet Take 1 tablet by mouth daily.     oxybutynin (DITROPAN-XL) 10 MG 24 hr tablet Take 1 tablet by mouth daily.     pantoprazole (PROTONIX) 40 MG tablet Take 1 tablet by mouth 2 (two) times daily.     PHENADOZ 25 MG suppository Place 1 suppository rectally as needed.     pravastatin (PRAVACHOL) 40 MG tablet Take 1 tablet by mouth daily.     promethazine (PHENERGAN) 25 MG tablet Take 1 tablet by mouth 3 (three) times daily.     SOTALOL AF 80 MG TABS Take 1 tablet by mouth daily.     traMADol (ULTRAM) 50 MG tablet Take 1 tablet by mouth 3 (three) times daily.     No current facility-administered medications on file prior to visit.    Review of Systems: No headache, visual changes, nausea, vomiting, diarrhea, constipation, dizziness, abdominal pain, skin rash, fevers, chills, night sweats, swollen lymph nodes, weight loss, chest pain, body aches, joint swelling, muscle aches, shortness of breath, mood changes, visual or auditory hallucinations  reported.  Objective:  There were no vitals filed for this visit. There were no vitals filed for this visit. There is no height or weight on file to calculate BMI.  General: Well Developed, well nourished, and in no acute distress.  Neuro: Alert and oriented x3, extra-ocular muscles intact, sensation grossly intact. Cranial nerves II through XII are grossly intact, motor, sensory, and coordinative functions are intact. HEENT: Normocephalic, atraumatic, pupils equal round reactive to light, neck supple, no masses, no lymphadenopathy, thyroid nonpalpable. Oropharynx, nasopharynx, external ear canals are unremarkable. Skin: Warm and dry, no rashes noted.  Cardiac: Regular rate and rhythm, no murmurs rubs or gallops. No peripheral edema.  Pulses symmetric. Respiratory: Clear to auscultation bilaterally. Not using accessory muscles, speaking in full sentences.  Abdominal: Soft, nontender, nondistended, positive bowel sounds, no masses, no organomegaly. Musculoskeletal: Shoulder, elbow, wrist, hip, knee, ankle stable, and with full range of motion.  Female chaperone initials: *** present throughout the physical examination.  Impression and Recommendations:   The patient was counselled, risk factors were discussed, and anticipatory guidance given.  Problem List Items Addressed This Visit   None    Orders & Medications Medications: No orders of the defined types were placed in this encounter.  No orders of the defined types were placed in this encounter.    No follow-ups on file.    Sandra Murphy, [place]   [facility] [place]

## 2021-12-09 ENCOUNTER — Other Ambulatory Visit: Payer: Self-pay | Admitting: Internal Medicine

## 2021-12-09 DIAGNOSIS — I495 Sick sinus syndrome: Secondary | ICD-10-CM

## 2021-12-11 ENCOUNTER — Other Ambulatory Visit: Payer: Self-pay | Admitting: Internal Medicine

## 2021-12-11 ENCOUNTER — Other Ambulatory Visit: Payer: Self-pay

## 2021-12-11 DIAGNOSIS — I495 Sick sinus syndrome: Secondary | ICD-10-CM

## 2021-12-11 MED ORDER — SOTALOL HCL (AF) 80 MG PO TABS: 1.0000 | ORAL_TABLET | Freq: Two times a day (BID) | ORAL | 0 refills | Status: DC

## 2021-12-13 ENCOUNTER — Ambulatory Visit: Payer: Self-pay | Admitting: *Deleted

## 2021-12-13 NOTE — Telephone Encounter (Signed)
Pharm requested call back to clarify Betapace AF when pt has been on Betapace HCL. Med has been called to local pharm now. Mail order pharm states they are different meds and to let MD know. She however did not know what was different.

## 2021-12-13 NOTE — Telephone Encounter (Signed)
[facility] called re Sotalol 80mg  AF due to mail order pharm states it is entirely different from med she has been on HCL. Spoke with pharmacist who reviewed compounds of both meds and stated they are not different. It is filled and ready for pt.

## 2021-12-20 ENCOUNTER — Other Ambulatory Visit: Payer: Self-pay | Admitting: *Deleted

## 2021-12-20 ENCOUNTER — Inpatient Hospital Stay
Admission: RE | Admit: 2021-12-20 | Discharge: 2021-12-20 | Disposition: A | Discharge status: Home / Self Care | Payer: Self-pay | Source: Ambulatory Visit | Attending: *Deleted | Admitting: *Deleted

## 2021-12-20 DIAGNOSIS — Z1231 Encounter for screening mammogram for malignant neoplasm of breast: Secondary | ICD-10-CM

## 2021-12-31 ENCOUNTER — Ambulatory Visit: Payer: Self-pay | Admitting: Internal Medicine

## 2022-01-06 ENCOUNTER — Ambulatory Visit: Payer: Medicare HMO | Admitting: Internal Medicine

## 2022-01-08 ENCOUNTER — Ambulatory Visit
Admission: RE | Admit: 2022-01-08 | Discharge: 2022-01-08 | Disposition: A | Discharge status: Home / Self Care | Payer: Medicare HMO | Source: Ambulatory Visit | Attending: Internal Medicine | Admitting: Internal Medicine

## 2022-01-08 DIAGNOSIS — N6314 Unspecified lump in the right breast, lower inner quadrant: Secondary | ICD-10-CM | POA: Diagnosis not present

## 2022-01-08 DIAGNOSIS — R928 Other abnormal and inconclusive findings on diagnostic imaging of breast: Secondary | ICD-10-CM | POA: Insufficient documentation

## 2022-01-08 DIAGNOSIS — N6310 Unspecified lump in the right breast, unspecified quadrant: Secondary | ICD-10-CM | POA: Insufficient documentation

## 2022-01-08 DIAGNOSIS — R92333 Mammographic heterogeneous density, bilateral breasts: Secondary | ICD-10-CM | POA: Diagnosis not present

## 2022-01-08 DIAGNOSIS — Z1239 Encounter for other screening for malignant neoplasm of breast: Secondary | ICD-10-CM | POA: Diagnosis not present

## 2022-01-08 DIAGNOSIS — N6312 Unspecified lump in the right breast, upper inner quadrant: Secondary | ICD-10-CM | POA: Diagnosis not present

## 2022-01-08 DIAGNOSIS — N6324 Unspecified lump in the left breast, lower inner quadrant: Secondary | ICD-10-CM | POA: Diagnosis not present

## 2022-01-09 ENCOUNTER — Other Ambulatory Visit: Payer: Self-pay | Admitting: Internal Medicine

## 2022-01-09 DIAGNOSIS — N6001 Solitary cyst of right breast: Secondary | ICD-10-CM

## 2022-01-09 DIAGNOSIS — R928 Other abnormal and inconclusive findings on diagnostic imaging of breast: Secondary | ICD-10-CM

## 2022-01-13 ENCOUNTER — Ambulatory Visit (INDEPENDENT_AMBULATORY_CARE_PROVIDER_SITE_OTHER): Payer: Medicare HMO

## 2022-01-13 VITALS — BP 123/74 | HR 71

## 2022-01-13 DIAGNOSIS — Z0000 Encounter for general adult medical examination without abnormal findings: Secondary | ICD-10-CM

## 2022-01-13 NOTE — Patient Instructions (Signed)

## 2022-01-13 NOTE — Progress Notes (Signed)
I connected with  Sandra Murphy on 01/13/22 by a audio enabled telemedicine application and verified that I am speaking with the correct person using two identifiers.  Patient Location: Home  Provider Location: Office/Clinic  I discussed the limitations of evaluation and management by telemedicine. The patient expressed understanding and agreed to proceed.  Subjective:   Sandra Murphy is a 88 y.o. female who presents for an Initial Medicare Annual Wellness Visit.  Review of Systems    Per HPI unless specifically indicated below.  Cardiac Risk Factors include: advanced age (>13men, >82 women);female gender, and hyperlipidemia.         Objective:       01/13/2022   10:11 AM 12/04/2021    3:55 PM 11/14/2020   11:50 AM  Vitals with BMI  Height  5\' 4"    Weight  175 lbs   BMI  30.02   Systolic 123 126  Diastolic 74 80 82  Pulse 71 60 73       01/13/2022   10:26 AM 01/22/2016    7:14 PM  Advanced Directives  Does Patient Have a Medical Advance Directive? Yes Yes  Type of 01/24/2016 of Estate agent Power of [place];Living will  Does patient want to make changes to medical advance directive? No - Patient declined   Copy of Healthcare Power of Attorney in Chart? No - copy requested No - copy requested  Would patient like information on creating a medical advance directive?  No - Patient declined    Current Medications (verified) Outpatient Encounter Medications as of 01/13/2022  Medication Sig   alendronate (FOSAMAX) 70 MG tablet Take 70 mg by mouth once a week.   Aspirin 81 MG CAPS Take 1 tablet by mouth daily.   clopidogrel (PLAVIX) 75 MG tablet Take by mouth.   Ergocalciferol (VITAMIN D2 PO) Take 1,250 mcg by mouth every 7 (seven) days.   FLUAD QUADRIVALENT 0.5 ML injection    levothyroxine (SYNTHROID) 50 MCG tablet Take by mouth.   LORazepam (ATIVAN) 0.5 MG tablet Take 1 tablet by mouth 2 (two) times daily.   mirtazapine (REMERON)  30 MG tablet Take 1 tablet by mouth daily.   NIFEdipine (PROCARDIA XL/NIFEDICAL XL) 60 MG 24 hr tablet Take 30 mg by mouth daily.   nitroGLYCERIN (NITROSTAT) 0.4 MG SL tablet Place under the tongue.   oxybutynin (DITROPAN-XL) 10 MG 24 hr tablet Take 1 tablet by mouth daily.   pantoprazole (PROTONIX) 40 MG tablet Take 1 tablet by mouth 2 (two) times daily.   pravastatin (PRAVACHOL) 40 MG tablet Take 1 tablet by mouth daily.   sacubitril-valsartan (ENTRESTO) 24-26 MG Take 1 tablet by mouth 2 (two) times daily.   SOTALOL AF 80 MG TABS Take 1 tablet (80 mg total) by mouth 2 (two) times daily.   No facility-administered encounter medications on file as of 01/13/2022.    Allergies (verified) Penicillins and Sulfa antibiotics   History: Past Medical History:  Diagnosis Date   CAD (coronary artery disease)    stents to proximal LAD and mid RCA   Hyperlipidemia    Hypertension    Hypothyroidism    IBS (irritable bowel syndrome)    Osteoarthritis    PVD (peripheral vascular disease) (HCC)    SVT (supraventricular tachycardia)    Past Surgical History:  Procedure Laterality Date   ABDOMINAL HYSTERECTOMY     ANTERIOR FUSION CERVICAL SPINE     twice   CHOLECYSTECTOMY  CORONARY ANGIOPLASTY WITH STENT PLACEMENT     proximal LAD and mid RCA   LUMBAR DISC SURGERY     Pace maker  10/2016   THYROIDECTOMY Left    Family History  Problem Relation Age of Onset   Cancer Mother    Hearing loss Father    Heart attack Father    Hypertension Sister    Heart disease Sister    Diabetes Sister    Heart disease Brother    Lung disease Brother    Social History   Socioeconomic History   Marital status: Widowed    Spouse name: Not on file   Number of children: 4   Years of education: Not on file   Highest education level: Not on file  Occupational History   Occupation: Retired  Tobacco Use   Smoking status: Never   Smokeless tobacco: Never  Vaping Use   Vaping Use: Never used   Substance and Sexual Activity   Alcohol use: No   Drug use: Never   Sexual activity: Not on file  Other Topics Concern   Not on file  Social History Narrative   1 stillborn child   Social Determinants of Health   Financial Resource Strain: Low Risk  (01/13/2022)   Overall Financial Resource Strain (CARDIA)    Difficulty of Paying Living Expenses: Not hard at all  Food Insecurity: No Food Insecurity (01/13/2022)   Hunger Vital Sign    Worried About Running Out of Food in the Last Year: Never true    Ran Out of Food in the Last Year: Never true  Transportation Needs: No Transportation Needs (01/13/2022)   PRAPARE - Administrator, Civil Service (Medical): No    Lack of Transportation (Non-Medical): No  Physical Activity: Inactive (01/13/2022)   Exercise Vital Sign    Days of Exercise per Week: 0 days    Minutes of Exercise per Session: 0 min  Stress: Stress Concern Present (01/13/2022)   [employer] of Occupational Health - Occupational Stress Questionnaire    Feeling of Stress : To some extent  Social Connections: Moderately Integrated (01/13/2022)   Social Connection and Isolation Panel [NHANES]    Frequency of Communication with Friends and Family: More than three times a week    Frequency of Social Gatherings with Friends and Family: More than three times a week    Attends Religious Services: More than 4 times per year    Active Member of [employer] or Organizations: Yes    Attends Banker Meetings: More than 4 times per year    Marital Status: Widowed    Tobacco Counseling Counseling given: No   Clinical Intake:  Pre-visit preparation completed: Yes  Pain : 0-10 Pain Score: 5  Pain Type: Chronic pain Pain Location: Back Pain Orientation: Lower Pain Descriptors / Indicators: Aching Pain Onset: More than a month ago Pain Frequency: Constant     Nutritional Status: BMI 25 -29 Overweight Nutritional Risks: None Diabetes: No  How  often do you need to have someone help you when you read instructions, pamphlets, or other written materials from your doctor or pharmacy?: 1 - Never  Diabetic?No  Interpreter Needed?: No  Information entered by :: Laurel Dimmer, CMA   Activities of Daily Living    01/13/2022   10:16 AM  In your present state of health, do you have any difficulty performing the following activities:  Hearing? 0  Vision? 1  Difficulty concentrating or making decisions? 0  Walking or climbing stairs? 1  Dressing or bathing? 0  Doing errands, shopping? 0    Patient Care Team: Reubin MilanBerglund, Laura H, MD as PCP - General (Internal Medicine)  Indicate any recent Medical Services you may have received from other than [facility] providers in the past year (date may be approximate) [facility] for Acute pulmonary edema. Assessment:   This is a routine wellness examination for [facility].  Hearing/Vision screen Denies any hearing issues. Denies any vision issues since her cataract surgery. Annual Eye Exam, [place]    Dietary issues and exercise activities discussed: Current Exercise Habits: The patient does not participate in regular exercise at present, Exercise limited by: orthopedic condition(s)   Goals Addressed   None    Depression Screen    01/13/2022   10:15 AM 12/04/2021    4:58 PM  PHQ 2/9 Scores  PHQ - 2 Score 0 0  PHQ- 9 Score 3 0    Fall Risk    01/13/2022   10:16 AM 12/04/2021    4:59 PM  Fall Risk   Falls in the past year? 0 0  Number falls in past yr: 0 0  Injury with Fall? 0 0  Risk for fall due to : No Fall Risks No Fall Risks  Follow up Falls evaluation completed Falls evaluation completed    FALL RISK PREVENTION PERTAINING TO THE HOME:  Any stairs in or around the home? Yes  If so, are there any without handrails? No  Home free of loose throw rugs in walkways, pet beds, electrical cords, etc? Yes  Adequate lighting in your home to reduce risk  of falls? Yes   ASSISTIVE DEVICES UTILIZED TO PREVENT FALLS:  Life alert? No  Use of a cane, walker or w/c? Yes  Grab bars in the bathroom? Yes  Shower chair or bench in shower? Yes  Elevated toilet seat or a handicapped toilet? Yes   TIMED UP AND GO:  Was the test performed?  Virtual telephone visit  .  Cognitive Function:        01/13/2022   10:17 AM  6CIT Screen  What Year? 0 points  What month? 0 points  What time? 0 points  Count back from 20 0 points  Months in reverse 0 points  Repeat phrase 4 points  Total Score 4 points    Immunizations Immunization History  Administered Date(s) Administered   Fluad Quad(high Dose 65+) 11/13/2021   Zoster Recombinat (Shingrix) 11/13/2021    TDAP status: Due, Education has been provided regarding the importance of this vaccine. Advised may receive this vaccine at local pharmacy or Health Dept. Aware to provide a copy of the vaccination record if obtained from local pharmacy or Health Dept. Verbalized acceptance and understanding.  Flu Vaccine status: Up to date  Pneumococcal vaccine status: Due, Education has been provided regarding the importance of this vaccine. Advised may receive this vaccine at local pharmacy or Health Dept. Aware to provide a copy of the vaccination record if obtained from local pharmacy or Health Dept. Verbalized acceptance and understanding.  Covid-19 vaccine status: Information provided on how to obtain vaccines.   Qualifies for Shingles Vaccine? Yes   Zostavax completed Yes   Shingrix Completed?: No.    Education has been provided regarding the importance of this vaccine. Patient has been advised to call insurance company to determine out of pocket expense if they have not yet received this vaccine. Advised may also receive vaccine at  local pharmacy or Health Dept. Verbalized acceptance and understanding.  Screening Tests Health Maintenance  Topic Date Due   COVID-19 Vaccine (1) Never done    Pneumonia Vaccine 45+ Years old (1 - PCV) Never done   DEXA SCAN  Never done   Zoster Vaccines- Shingrix (2 of 2) 01/08/2022   Medicare Annual Wellness (AWV)  01/14/2023   INFLUENZA VACCINE  Completed   HPV VACCINES  Aged Out    Health Maintenance  Health Maintenance Due  Topic Date Due   COVID-19 Vaccine (1) Never done   Pneumonia Vaccine 17+ Years old (1 - PCV) Never done   DEXA SCAN  Never done   Zoster Vaccines- Shingrix (2 of 2) 01/08/2022    Colorectal cancer screening: No longer required.   Mammogram status: No longer required due to age.  Dexa scan: overdue   Lung Cancer Screening: (Low Dose CT Chest recommended if Age 81-80 years, 30 pack-year currently smoking OR have quit w/in 15years.) does not qualify.   Lung Cancer Screening Referral: no  Additional Screening:  Hepatitis C Screening: does not qualify  Vision Screening: Recommended annual ophthalmology exams for early detection of glaucoma and other disorders of the eye. Is the patient up to date with their annual eye exam?  Yes  Who is the provider or what is the name of the office in which the patient attends annual eye exams? [place]  If pt is not established with a provider, would they like to be referred to a provider to establish care? No .   Dental Screening: Recommended annual dental exams for proper oral hygiene  Community Resource Referral / Chronic Care Management: CRR required this visit?  No   CCM required this visit?  No      Plan:     I have personally reviewed and noted the following in the patient's chart:   Medical and social history Use of alcohol, tobacco or illicit drugs  Current medications and supplements including opioid prescriptions. Patient is not currently taking opioid prescriptions. Functional ability and status Nutritional status Physical activity Advanced directives List of other physicians Hospitalizations, surgeries, and ER visits in previous 12  months Vitals Screenings to include cognitive, depression, and falls Referrals and appointments  In addition, I have reviewed and discussed with patient certain preventive protocols, quality metrics, and best practice recommendations. A written personalized care plan for preventive services as well as general preventive health recommendations were provided to patient.    Ms. Wisdom , Thank you for taking time to come for your Medicare Wellness Visit. I appreciate your ongoing commitment to your health goals. Please review the following plan we discussed and let me know if I can assist you in the future.   These are the goals we discussed:  Goals   None     This is a list of the screening recommended for you and due dates:  Health Maintenance  Topic Date Due   COVID-19 Vaccine (1) Never done   Pneumonia Vaccine (1 - PCV) Never done   DEXA scan (bone density measurement)  Never done   Zoster (Shingles) Vaccine (2 of 2) 01/08/2022   Medicare Annual Wellness Visit  01/14/2023   Flu Shot  Completed   HPV Vaccine  Aged Out      [place], [place]   01/13/2022   Nurse Notes: Approximately 30 minute Non-Face -To-Face Medicare Wellness Visit

## 2022-01-28 ENCOUNTER — Other Ambulatory Visit: Payer: Self-pay | Admitting: Internal Medicine

## 2022-01-29 ENCOUNTER — Other Ambulatory Visit: Payer: Self-pay | Admitting: Internal Medicine

## 2022-01-29 NOTE — Telephone Encounter (Signed)
Medication Refill - Medication: clopidogrel (PLAVIX) 75 MG tablet  REFUSED because says has to get from cardiologist, but her previous cardiologist is in [place] and she doesn't live there aymore and is going ot see cardiologist in [place] soon, so asking for supply till then  Has the patient contacted their pharmacy? yes (Agent: If no, request that the patient contact the pharmacy for the refill. If patient does not wish to contact the pharmacy document the reason why and proceed with request.) (Agent: If yes, when and what did the pharmacy advise?)contact pcp  Preferred Pharmacy (with phone number or street name): [facility]  Has the patient been seen for an appointment in the last year OR does the patient have an upcoming appointment? yes  Agent: Please be advised that RX refills may take up to 3 business days. We ask that you follow-up with your pharmacy.

## 2022-01-30 MED ORDER — CLOPIDOGREL BISULFATE 75 MG PO TABS: 75.0000 mg | ORAL_TABLET | Freq: Every day | ORAL | 0 refills | Status: DC

## 2022-01-30 NOTE — Telephone Encounter (Signed)
Requested medication (s) are due for refill today: historical medication  Requested medication (s) are on the active medication list: yes  Last refill:  na   Future visit scheduled: yes in 1 month  Notes to clinic:  historical medication last ordered by cardiologist. Pt requesting PCP or order Rx and manage care due to cardiologist moved to [place]. Please advise .      Requested Prescriptions  Pending Prescriptions Disp Refills   clopidogrel (PLAVIX) 75 MG tablet      Sig: Take by mouth.     Hematology: Antiplatelets - clopidogrel Failed - 01/30/2022 11:56 AM      Failed - HCT in normal range and within 180 days    HCT  Date Value Ref Range Status  01/22/2016 37.0 35.0 - 47.0 % Final         Failed - HGB in normal range and within 180 days    Hemoglobin  Date Value Ref Range Status  01/22/2016 12.6 12.0 - 16.0 g/dL Final         Failed - PLT in normal range and within 180 days    Platelets  Date Value Ref Range Status  01/22/2016 211 150 - 440 K/uL Final         Failed - Cr in normal range and within 360 days    Creatinine, Ser  Date Value Ref Range Status  01/22/2016 0.88 0.44 - 1.00 mg/dL Final         Passed - Valid encounter within last 6 months    Recent Outpatient Visits           1 month ago Atherosclerosis of native coronary artery of native heart without angina pectoris   [facility] at [facility], Nyoka Cowden, MD       Future Appointments             In 1 month Gollan, Tollie Pizza, MD [facility] [place]. [facility]   In 1 month Judithann Graves, Nyoka Cowden, MD [facility] Medicine at [facility], [facility]

## 2022-01-30 NOTE — Telephone Encounter (Signed)
Daughter Olegario Messier calling to follow up on this refill request. Needs to go to  [facility] - [place], [place] - [address]   (Not [facility])  Olegario Messier can be reached at  [phone]

## 2022-02-03 ENCOUNTER — Ambulatory Visit: Payer: Medicare Other | Admitting: Cardiovascular Disease

## 2022-02-03 DIAGNOSIS — Z950 Presence of cardiac pacemaker: Secondary | ICD-10-CM | POA: Diagnosis not present

## 2022-02-05 ENCOUNTER — Ambulatory Visit: Payer: Medicare HMO | Admitting: Internal Medicine

## 2022-03-03 ENCOUNTER — Encounter: Payer: Self-pay | Admitting: *Deleted

## 2022-03-05 ENCOUNTER — Ambulatory Visit
Admission: RE | Admit: 2022-03-05 | Discharge: 2022-03-05 | Disposition: A | Discharge status: Home / Self Care | Payer: Medicare HMO | Source: Ambulatory Visit | Attending: Internal Medicine | Admitting: Internal Medicine

## 2022-03-05 ENCOUNTER — Other Ambulatory Visit: Payer: Self-pay | Admitting: Internal Medicine

## 2022-03-05 DIAGNOSIS — I495 Sick sinus syndrome: Secondary | ICD-10-CM

## 2022-03-05 DIAGNOSIS — N6001 Solitary cyst of right breast: Secondary | ICD-10-CM | POA: Insufficient documentation

## 2022-03-05 DIAGNOSIS — R928 Other abnormal and inconclusive findings on diagnostic imaging of breast: Secondary | ICD-10-CM

## 2022-03-05 MED ORDER — LIDOCAINE HCL (PF) 1 % IJ SOLN
3.0000 mL | Freq: Once | INTRAMUSCULAR | Status: AC
  Administered 2022-03-05: 3 mL

## 2022-03-05 MED ORDER — SOTALOL HCL (AF) 80 MG PO TABS: 1.0000 | ORAL_TABLET | Freq: Two times a day (BID) | ORAL | 0 refills | Status: DC

## 2022-03-06 ENCOUNTER — Other Ambulatory Visit: Payer: Self-pay

## 2022-03-06 ENCOUNTER — Encounter: Payer: Self-pay | Admitting: Internal Medicine

## 2022-03-06 MED ORDER — PANTOPRAZOLE SODIUM 40 MG PO TBEC: 40.0000 mg | DELAYED_RELEASE_TABLET | Freq: Two times a day (BID) | ORAL | 1 refills | Status: DC

## 2022-03-06 MED ORDER — VITAMIN D2 50 MCG (2000 UT) PO TABS: 1250.0000 ug | ORAL_TABLET | ORAL | 1 refills | Status: DC

## 2022-03-09 NOTE — Progress Notes (Unsigned)
Cardiology Office Note  Date:  03/10/2022   ID:  NAVA SONG, DOB November 25, 1933, MRN [id number]  PCP:  Glean Hess, MD   Chief Complaint  Patient presents with   New Patient (Initial Visit)    Ref by Dr. Army Melia to establish care for CAD; s/p stent placement x 2 & PPM. Medications reviewed by the patient's bottles. "Doing well." Patient moved to [facility] in 12/2021 from [place].      HPI:  [name] is a 89 year old woman with past medical history of Back pain, anxiety, Hypertension, hyperlipidemia DVT in summer 2022,  CAD,  s/p stents to proximal LAD and mid RCA, 2013 .  Hx of PVCs and PACs.  Pacer Who presents by referral from Dr. Halina Maidens for tachybradycardia syndrome, coronary artery disease  ED visit in June to [facility] for mild fluid overload.   [facility], previously followed by cardiology 1) Tachy-Bradycardia/SVT/NSVT A. 7-day monitor 10/23-10/30/17: showing frequent atrial and ventricular ectopy, SVT and NSVT, with an average HR of 69, low 50, high 179, 15% PACs, 2.4% PVCs B. EKG at primary cardiology appointment showing NSR with PACs, HR 63  C. Treatment with Sotalol D. Event Monitor 6/4-08/26/2016: NSR with PAF, HR 40-119 bpm, Average 60 bpm E. Continued symptoms of palpitations on low dose Sotalol, also with bradycardia on low dose Sotalol F. implantation of a DDDR permanent pacemaker [employer] 11/07/2016   2) SVT/PAC/PVC, started on sotalol at prior cardiology office   3) Coronary artery disease  A. History of stents to proximal LAD and mid RCA  B. Transthoracic echocardiogram 10/2014 showing EF 60-65% with no significant valvular disease  C. Left heart catheterization 03/27/15 showing mild to moderate CAD with patent stents noted to the proximal LAD and mid RCA, EF 60%  D. Transthoracic echocardiogram 10/08/15 showing EF 70-75% and no significant valvular disease  E. Negative stress test 09/2015 - data deficient  F. LHC 01/09/16:  reported no change from [facility] in January G. LHC 06/2016: mild CAD, patent stents in prox LAD and mid large dominant RCA.  Manual Device Interrogation: July 2023  6 [facility] DDDR, Normal Function. 4 years on battery. 76% RA paced 3% burden, 4% AT at 170-180 bpm   Most recent left heart catheterization by Dr. Stephania Fragmin  demonstrated no significant CAD and elevated left heart pressures. Patent stents, She was placed on daily Lasix   EKG personally reviewed by myself on todays visit Normal sinus rhythm rate 70 bpm QT 474  PMH:   has a past medical history of CAD (coronary artery disease), Hyperlipidemia, Hypertension, Hypothyroidism, IBS (irritable bowel syndrome), Osteoarthritis, PVD (peripheral vascular disease) ([place]), and SVT (supraventricular tachycardia).  PSH:    Past Surgical History:  Procedure Laterality Date   ABDOMINAL HYSTERECTOMY     ANTERIOR FUSION CERVICAL SPINE     twice   CHOLECYSTECTOMY     CORONARY ANGIOPLASTY WITH STENT PLACEMENT     proximal LAD and mid RCA   LUMBAR DISC SURGERY     Pace maker  11/06/2016   Medtronic [employer] Scietific Pacemaker Model L311/ Serial [id number]   THYROIDECTOMY Left     Current Outpatient Medications  Medication Sig Dispense Refill   alendronate (FOSAMAX) 70 MG tablet Take 70 mg by mouth once a week.     Aspirin 81 MG CAPS Take 1 tablet by mouth daily.     clopidogrel (PLAVIX) 75 MG tablet Take 1 tablet (75 mg total) by mouth daily. 90 tablet 0  Ergocalciferol (VITAMIN D2) 50 MCG (2000 UT) TABS Take 1,250 mcg by mouth every 7 (seven) days. 12 tablet 1   FLUAD QUADRIVALENT 0.5 ML injection      levothyroxine (SYNTHROID) 50 MCG tablet Take by mouth.     LORazepam (ATIVAN) 0.5 MG tablet Take 1 tablet by mouth 2 (two) times daily.     mirtazapine (REMERON) 30 MG tablet Take 1 tablet by mouth daily.     NIFEdipine (PROCARDIA XL/NIFEDICAL XL) 60 MG 24 hr tablet Take 30 mg by mouth daily.     nitroGLYCERIN (NITROSTAT) 0.4 MG SL tablet Place under the  tongue.     oxybutynin (DITROPAN-XL) 10 MG 24 hr tablet Take 1 tablet by mouth daily.     pantoprazole (PROTONIX) 40 MG tablet Take 1 tablet (40 mg total) by mouth 2 (two) times daily. 180 tablet 1   pravastatin (PRAVACHOL) 40 MG tablet Take 1 tablet by mouth daily.     sacubitril-valsartan (ENTRESTO) 24-26 MG Take 1 tablet by mouth 2 (two) times daily.     SOTALOL AF 80 MG TABS Take 1 tablet (80 mg total) by mouth 2 (two) times daily. 180 tablet 0   No current facility-administered medications for this visit.    Allergies:   Penicillins, Other, and Sulfa antibiotics   Social History:  The patient  reports that she has never smoked. She has never used smokeless tobacco. She reports that she does not drink alcohol and does not use drugs.   Family History:   family history includes Cancer in her mother; Diabetes in her sister; Hearing loss in her father; Heart attack in her father; Heart disease in her brother and sister; Hypertension in her sister; Liver cancer in her sister; Lung disease in her brother; Pancreatic cancer in her sister.    Review of Systems: Review of Systems  Constitutional: Negative.   HENT: Negative.    Respiratory: Negative.    Cardiovascular: Negative.   Gastrointestinal: Negative.   Musculoskeletal: Negative.   Neurological: Negative.   Psychiatric/Behavioral: Negative.    All other systems reviewed and are negative.   PHYSICAL EXAM: VS:  BP 130/80 (BP Location: Right Arm, Patient Position: Sitting, Cuff Size: Normal)   Pulse 70   Ht 5\' 4"  (1.626 m)   Wt 175 lb 4 oz (79.5 kg)   BMI 30.08 kg/m  , BMI Body mass index is 30.08 kg/m. GEN: Well nourished, well developed, in no acute distress HEENT: normal Neck: no JVD, carotid bruits, or masses Cardiac: RRR; no murmurs, rubs, or gallops, trace nonpitting lower extremity edema  Respiratory:  clear to auscultation bilaterally, normal work of breathing GI: soft, nontender, nondistended, + BS MS: no deformity  or atrophy Skin: warm and dry, no rash Neuro:  Strength and sensation are intact Psych: euthymic mood, full affect  Recent Labs: No results found for requested labs within last 365 days.    Lipid Panel No results found for: "CHOL", "HDL", "LDLCALC", "TRIG"    Wt Readings from Last 3 Encounters:  03/10/22 175 lb 4 oz (79.5 kg)  12/04/21 175 lb (79.4 kg)  11/14/20 171 lb (77.6 kg)      ASSESSMENT AND PLAN:  Problem List Items Addressed This Visit       Cardiology Problems   Mixed hyperlipidemia (Chronic)   PVD (peripheral vascular disease) (HCC) (Chronic)   Tachy-brady syndrome (HCC) (Chronic)   Other Visit Diagnoses     Coronary artery disease of native artery of native heart with stable  angina pectoris ([place])    -  Primary   History of DVT (deep vein thrombosis)       Pacemaker          Tachybradycardia syndrome New to the area, previously placed on sotalol for tachyarrhythmia No documentation of atrial fibrillation per outpatient notes reviewed Currently asymptomatic, we will continue sotalol for now Family reports she is scheduled to have repeat lab work with primary care possibly later this week  Coronary disease with stable angina Currently with no symptoms of angina. No further workup at this time. Continue current medication regimen. Recommend she request lipid panel from primary care Goal LDL less than 70 Several catheterizations over the past several years, stable disease, patent stents Remains on aspirin Plavix, she feels comfortable staying on both of these for now but we did discuss possibly dropping the Plavix  Pacemaker Presumed for symptomatic bradycardia Pacemaker in place, will arrange follow-up with the EP  Hyperlipidemia Continue pravastatin, goal LDL less than 70   Total encounter time more than 60 minutes  Greater than 50% was spent in counseling and coordination of care with the patient    Signed, Esmond Plants, M.D., Ph.D. [place] [facility], [place]

## 2022-03-10 ENCOUNTER — Ambulatory Visit: Payer: Medicare HMO | Attending: Cardiovascular Disease | Admitting: Cardiovascular Disease

## 2022-03-10 ENCOUNTER — Encounter: Payer: Self-pay | Admitting: Cardiovascular Disease

## 2022-03-10 VITALS — BP 130/80 | HR 70 | Ht 64.0 in | Wt 175.2 lb

## 2022-03-10 DIAGNOSIS — I495 Sick sinus syndrome: Secondary | ICD-10-CM

## 2022-03-10 DIAGNOSIS — E782 Mixed hyperlipidemia: Secondary | ICD-10-CM | POA: Diagnosis not present

## 2022-03-10 DIAGNOSIS — I25118 Atherosclerotic heart disease of native coronary artery with other forms of angina pectoris: Secondary | ICD-10-CM | POA: Diagnosis not present

## 2022-03-10 DIAGNOSIS — Z86718 Personal history of other venous thrombosis and embolism: Secondary | ICD-10-CM | POA: Diagnosis not present

## 2022-03-10 DIAGNOSIS — Z950 Presence of cardiac pacemaker: Secondary | ICD-10-CM

## 2022-03-10 DIAGNOSIS — I739 Peripheral vascular disease, unspecified: Secondary | ICD-10-CM

## 2022-03-10 MED ORDER — CLOPIDOGREL BISULFATE 75 MG PO TABS: 75.0000 mg | ORAL_TABLET | Freq: Every day | ORAL | 3 refills | Status: DC

## 2022-03-10 MED ORDER — NIFEDIPINE ER OSMOTIC RELEASE 30 MG PO TB24: ORAL_TABLET | ORAL | 3 refills | Status: DC

## 2022-03-10 MED ORDER — SOTALOL HCL (AF) 80 MG PO TABS: 1.0000 | ORAL_TABLET | Freq: Two times a day (BID) | ORAL | 1 refills | Status: DC

## 2022-03-10 MED ORDER — ENTRESTO 24-26 MG PO TABS: 1.0000 | ORAL_TABLET | Freq: Two times a day (BID) | ORAL | 3 refills | Status: DC

## 2022-03-10 MED ORDER — PRAVASTATIN SODIUM 40 MG PO TABS: 40.0000 mg | ORAL_TABLET | Freq: Every day | ORAL | 3 refills | Status: DC

## 2022-03-10 NOTE — Patient Instructions (Addendum)
Medication Instructions:  No changes  If you need a refill on your cardiac medications before your next appointment, please call your pharmacy.    Lab work: No new labs needed   Testing/Procedures: - You have been referred to : Electrophysiology (Dr. Quentin Ore Dr. Caryl Comes) To establish care of your pacemaker    Follow-Up: At [facility], you and your health needs are our priority.  As part of our continuing mission to provide you with exceptional heart care, we have created designated Provider Care Teams.  These Care Teams include your primary Cardiologist (physician) and Advanced Practice Providers (APPs -  Physician Assistants and Nurse Practitioners) who all work together to provide you with the care you need, when you need it.  You will need a follow up appointment in 12 months  Providers on your designated Care Team:   Murray Hodgkins, NP Christell Faith, PA-C Cadence Kathlen Mody, [place]  COVID-19 Vaccine Information can be found at: [email or web address] For questions related to vaccine distribution or appointments, please email [email or web address] or call [phone].

## 2022-03-12 ENCOUNTER — Ambulatory Visit: Payer: Medicare HMO | Admitting: Internal Medicine

## 2022-03-26 ENCOUNTER — Ambulatory Visit (INDEPENDENT_AMBULATORY_CARE_PROVIDER_SITE_OTHER): Payer: Medicare HMO | Admitting: Internal Medicine

## 2022-03-26 ENCOUNTER — Encounter: Payer: Self-pay | Admitting: Internal Medicine

## 2022-03-26 VITALS — BP 124/79 | HR 71 | Ht 60.24 in | Wt 172.6 lb

## 2022-03-26 DIAGNOSIS — M810 Age-related osteoporosis without current pathological fracture: Secondary | ICD-10-CM

## 2022-03-26 DIAGNOSIS — E039 Hypothyroidism, unspecified: Secondary | ICD-10-CM | POA: Diagnosis not present

## 2022-03-26 DIAGNOSIS — E782 Mixed hyperlipidemia: Secondary | ICD-10-CM

## 2022-03-26 DIAGNOSIS — F411 Generalized anxiety disorder: Secondary | ICD-10-CM

## 2022-03-26 DIAGNOSIS — K219 Gastro-esophageal reflux disease without esophagitis: Secondary | ICD-10-CM

## 2022-03-26 MED ORDER — LORAZEPAM 0.5 MG PO TABS: 0.5000 mg | ORAL_TABLET | Freq: Two times a day (BID) | ORAL | 5 refills | Status: DC

## 2022-03-26 NOTE — Progress Notes (Signed)
Date:  03/26/2022   Name:  Sandra Murphy   DOB:  12/26/1933   MRN:  [id number]   Chief Complaint: Hypothyroidism and Gastroesophageal Reflux  Thyroid Problem Presents for follow-up visit. Patient reports no anxiety, constipation, diarrhea, fatigue or palpitations. The symptoms have been stable. Her past medical history is significant for hyperlipidemia.  Gastroesophageal Reflux She complains of heartburn. She reports no abdominal pain, no chest pain, no coughing or no wheezing. This is a recurrent problem. The problem occurs occasionally. Pertinent negatives include no fatigue. She has tried a PPI for the symptoms. The treatment provided significant relief.  Hyperlipidemia This is a chronic problem. The problem is controlled. Pertinent negatives include no chest pain or shortness of breath. Current antihyperlipidemic treatment includes statins.  Osteoporosis - due for repeat DEXA.  On Alendronate and Calcium with D.   Lab Results  Component Value Date   NA 140 01/22/2016   K 3.5 01/22/2016   CO2 24 01/22/2016   GLUCOSE 203 (H) 01/22/2016   BUN 15 01/22/2016   CREATININE 0.88 01/22/2016   CALCIUM 8.7 (L) 01/22/2016   GFRNONAA >60 01/22/2016   No results found for: "CHOL", "HDL", "LDLCALC", "LDLDIRECT", "TRIG", "CHOLHDL" No results found for: "TSH" No results found for: "HGBA1C" Lab Results  Component Value Date   WBC 5.7 01/22/2016   HGB 12.6 01/22/2016   HCT 37.0 01/22/2016   MCV 89.2 01/22/2016   PLT 211 01/22/2016   Lab Results  Component Value Date   ALT 11 (L) 01/22/2016   AST 16 01/22/2016   ALKPHOS 94 01/22/2016   BILITOT 0.5 01/22/2016   No results found for: "25OHVITD2", "25OHVITD3", "VD25OH"   Review of Systems  Constitutional:  Negative for appetite change, fatigue and unexpected weight change.  HENT:  Negative for nosebleeds and trouble swallowing.   Eyes:  Negative for visual disturbance.  Respiratory:  Negative for cough, chest tightness, shortness of  breath and wheezing.   Cardiovascular:  Positive for leg swelling. Negative for chest pain and palpitations.  Gastrointestinal:  Positive for heartburn. Negative for abdominal pain, constipation and diarrhea.       Had GI virus 4 days ago but sx resolved  Musculoskeletal:  Positive for arthralgias and back pain.  Neurological:  Negative for dizziness, weakness, light-headedness and headaches.  Psychiatric/Behavioral:  Positive for sleep disturbance. Negative for dysphoric mood. The patient is not nervous/anxious.     Patient Active Problem List   Diagnosis Date Noted   Tachy-brady syndrome ([place]) 12/04/2021   GERD without esophagitis 12/04/2021   Generalized anxiety disorder 12/04/2021   Degenerative disc disease, lumbar 12/04/2021   OAB (overactive bladder) 12/04/2021   Acquired hypothyroidism 12/04/2021   Mixed hyperlipidemia 08/08/2021   Hx of deep venous thrombosis 08/08/2021   Osteoporosis 08/08/2021   PVD (peripheral vascular disease) ([place]) 08/08/2021   Hx of heart artery stent 08/08/2021   Hiatal hernia 08/08/2021   Asthma 11/14/2020   Atherosclerotic heart disease of native coronary artery without angina pectoris 01/10/2016   PVCs (premature ventricular contractions) 01/10/2016    Allergies  Allergen Reactions   Penicillins Hives   Other Other (See Comments)   Sulfa Antibiotics Nausea Only, Other (See Comments) and Diarrhea    HEADACHE     Past Surgical History:  Procedure Laterality Date   ABDOMINAL HYSTERECTOMY     ANTERIOR FUSION CERVICAL SPINE     twice   CHOLECYSTECTOMY     CORONARY ANGIOPLASTY WITH STENT PLACEMENT     proximal  LAD and mid RCA   LUMBAR Ong maker  11/06/2016   Medtronic [employer] Scietific Pacemaker Model L311/ Serial [id number]   THYROIDECTOMY Left     Social History   Tobacco Use   Smoking status: Never   Smokeless tobacco: Never  Vaping Use   Vaping Use: Never used  Substance Use Topics   Alcohol use: No   Drug use:  Never     Medication list has been reviewed and updated.  Current Meds  Medication Sig   alendronate (FOSAMAX) 70 MG tablet Take 70 mg by mouth once a week.   Aspirin 81 MG CAPS Take 1 tablet by mouth daily.   clopidogrel (PLAVIX) 75 MG tablet Take 1 tablet (75 mg total) by mouth daily.   levothyroxine (SYNTHROID) 50 MCG tablet Take by mouth.   mirtazapine (REMERON) 30 MG tablet Take 1 tablet by mouth daily.   NIFEdipine (PROCARDIA-XL/NIFEDICAL-XL) 30 MG 24 hr tablet Take 1 tablet (30 mg) by mouth once daily   nitroGLYCERIN (NITROSTAT) 0.4 MG SL tablet Place under the tongue.   oxybutynin (DITROPAN-XL) 10 MG 24 hr tablet Take 1 tablet by mouth daily.   pantoprazole (PROTONIX) 40 MG tablet Take 1 tablet (40 mg total) by mouth 2 (two) times daily.   pravastatin (PRAVACHOL) 40 MG tablet Take 1 tablet (40 mg total) by mouth daily.   sacubitril-valsartan (ENTRESTO) 24-26 MG Take 1 tablet by mouth 2 (two) times daily.   SOTALOL AF 80 MG TABS Take 1 tablet (80 mg total) by mouth 2 (two) times daily.   [DISCONTINUED] Ergocalciferol (VITAMIN D2) 50 MCG (2000 UT) TABS Take 1,250 mcg by mouth every 7 (seven) days.   [DISCONTINUED] LORazepam (ATIVAN) 0.5 MG tablet Take 1 tablet by mouth 2 (two) times daily.       03/26/2022   10:28 AM 12/04/2021    4:59 PM  GAD 7 : Generalized Anxiety Score  Nervous, Anxious, on Edge 1 0  Control/stop worrying 0 0  Worry too much - different things 0 0  Trouble relaxing 0 0  Restless 0 0  Easily annoyed or irritable 0 0  Afraid - awful might happen 0 0  Total GAD 7 Score 1 0  Anxiety Difficulty Not difficult at all Not difficult at all       03/26/2022   10:28 AM 01/13/2022   10:15 AM 12/04/2021    4:58 PM  Depression screen PHQ 2/9  Decreased Interest 0 0 0  Down, Depressed, Hopeless 0 0 0  PHQ - 2 Score 0 0 0  Altered sleeping 0 0 0  Tired, decreased energy 0 3 0  Change in appetite 0 0 0  Feeling bad or failure about yourself  0 0 0  Trouble  concentrating 0 0 0  Moving slowly or fidgety/restless 0 0 0  Suicidal thoughts 0 0 0  PHQ-9 Score 0 3 0  Difficult doing work/chores Not difficult at all Not difficult at all Not difficult at all    BP Readings from Last 3 Encounters:  03/26/22 124/79  03/10/22 130/80  01/13/22 123/74    Physical Exam Vitals and nursing note reviewed.  Constitutional:      General: She is not in acute distress.    Appearance: Normal appearance. She is well-developed.  HENT:     Head: Normocephalic and atraumatic.  Neck:     Thyroid: No thyromegaly or thyroid tenderness.     Vascular: No carotid bruit.  Cardiovascular:     Rate and Rhythm: Normal rate and regular rhythm.     Heart sounds: No murmur heard. Pulmonary:     Effort: Pulmonary effort is normal. No respiratory distress.     Breath sounds: No wheezing or rhonchi.  Musculoskeletal:     Cervical back: Normal range of motion.     Right lower leg: No edema.     Left lower leg: No edema.     Comments: Thoracic kyphosis  Lymphadenopathy:     Cervical: No cervical adenopathy.  Skin:    General: Skin is warm and dry.     Findings: No rash.  Neurological:     Mental Status: She is alert and oriented to person, place, and time.  Psychiatric:        Mood and Affect: Mood normal.        Behavior: Behavior normal.     Wt Readings from Last 3 Encounters:  03/26/22 172 lb 9.6 oz (78.3 kg)  03/10/22 175 lb 4 oz (79.5 kg)  12/04/21 175 lb (79.4 kg)    BP 124/79   Pulse 71   Ht 5' 0.24" (1.53 m)   Wt 172 lb 9.6 oz (78.3 kg)   SpO2 98%   BMI 33.45 kg/m   Assessment and Plan: Problem List Items Addressed This Visit       Digestive   GERD without esophagitis - Primary (Chronic)    Symptoms well controlled on daily PPI No red flag signs such as weight loss, n/v, melena Will continue omeprazole.       Relevant Orders   CBC with Differential/Platelet     Endocrine   Acquired hypothyroidism (Chronic)    On thyroid  replacement therapy Feels well without concerns      Relevant Orders   TSH + free T4     Musculoskeletal and Integument   Osteoporosis (Chronic)    Currently on Fosamax and vitamin D for about 4 years Due for repeat DEXA      Relevant Orders   DG Bone Density     Other   Generalized anxiety disorder (Chronic)    She has been on low dose lorazepam 0.5 mg bid for years Also on Remeron. Appetite and sleep are good. Will continue current therapy      Relevant Medications   LORazepam (ATIVAN) 0.5 MG tablet   Mixed hyperlipidemia (Chronic)    Tolerating statin medication without side effects at this time LDL is No results found for: "LDLCALC" with a goal of < 70. Current dose is appropriate but will be adjusted if needed.       Relevant Orders   Comprehensive metabolic panel   Lipid panel     Partially dictated using Dragon software. Any errors are unintentional.  Halina Maidens, MD [facility]  03/26/2022

## 2022-03-26 NOTE — Assessment & Plan Note (Signed)
Symptoms well controlled on daily PPI No red flag signs such as weight loss, n/v, melena Will continue omeprazole.  

## 2022-03-26 NOTE — Assessment & Plan Note (Signed)
On thyroid replacement therapy Feels well without concerns

## 2022-03-26 NOTE — Assessment & Plan Note (Signed)
Tolerating statin medication without side effects at this time LDL is No results found for: "LDLCALC" with a goal of < 70. Current dose is appropriate but will be adjusted if needed.

## 2022-03-26 NOTE — Assessment & Plan Note (Signed)
Currently on Fosamax and vitamin D for about 4 years Due for repeat DEXA

## 2022-03-26 NOTE — Assessment & Plan Note (Signed)
She has been on low dose lorazepam 0.5 mg bid for years Also on Remeron. Appetite and sleep are good. Will continue current therapy

## 2022-03-26 NOTE — Patient Instructions (Signed)
Call [facility] to schedule your Bone Density at [phone].  Ask for [place] location.

## 2022-03-27 LAB — CBC WITH DIFFERENTIAL/PLATELET
Basophils Absolute: 0 10*3/uL (ref 0.0–0.2)
Basos: 0 %
EOS (ABSOLUTE): 0 10*3/uL (ref 0.0–0.4)
Eos: 1 %
Hematocrit: 40.3 % (ref 34.0–46.6)
Hemoglobin: 13.4 g/dL (ref 11.1–15.9)
Immature Grans (Abs): 0 10*3/uL (ref 0.0–0.1)
Immature Granulocytes: 0 %
Lymphocytes Absolute: 1.5 10*3/uL (ref 0.7–3.1)
Lymphs: 29 %
MCH: 29.8 pg (ref 26.6–33.0)
MCHC: 33.3 g/dL (ref 31.5–35.7)
MCV: 90 fL (ref 79–97)
Monocytes Absolute: 0.5 10*3/uL (ref 0.1–0.9)
Monocytes: 9 %
Neutrophils Absolute: 3.2 10*3/uL (ref 1.4–7.0)
Neutrophils: 61 %
Platelets: 238 10*3/uL (ref 150–450)
RBC: 4.49 x10E6/uL (ref 3.77–5.28)
RDW: 12.4 % (ref 11.7–15.4)
WBC: 5.2 10*3/uL (ref 3.4–10.8)

## 2022-03-27 LAB — LIPID PANEL
Chol/HDL Ratio: 3.4 ratio (ref 0.0–4.4)
Cholesterol, Total: 166 mg/dL (ref 100–199)
HDL: 49 mg/dL (ref >39)
LDL Chol Calc (NIH): 83 mg/dL (ref 0–99)
Triglycerides: 205 mg/dL — ABNORMAL HIGH (ref 0–149)
VLDL Cholesterol Cal: 34 mg/dL (ref 5–40)

## 2022-03-27 LAB — COMPREHENSIVE METABOLIC PANEL
ALT: 13 IU/L (ref 0–32)
AST: 13 IU/L (ref 0–40)
Albumin/Globulin Ratio: 2 (ref 1.2–2.2)
Albumin: 4.3 g/dL (ref 3.7–4.7)
Alkaline Phosphatase: 90 IU/L (ref 44–121)
BUN/Creatinine Ratio: 19 (ref 12–28)
BUN: 16 mg/dL (ref 8–27)
Bilirubin Total: 0.3 mg/dL (ref 0.0–1.2)
CO2: 21 mmol/L (ref 20–29)
Calcium: 9.4 mg/dL (ref 8.7–10.3)
Chloride: 106 mmol/L (ref 96–106)
Creatinine, Ser: 0.83 mg/dL (ref 0.57–1.00)
Globulin, Total: 2.2 g/dL (ref 1.5–4.5)
Glucose: 111 mg/dL — ABNORMAL HIGH (ref 70–99)
Potassium: 4.1 mmol/L (ref 3.5–5.2)
Sodium: 142 mmol/L (ref 134–144)
Total Protein: 6.5 g/dL (ref 6.0–8.5)
eGFR: 69 mL/min/{1.73_m2} (ref >59)

## 2022-03-27 LAB — TSH+FREE T4
Free T4: 1.25 ng/dL (ref 0.82–1.77)
TSH: 1.34 u[IU]/mL (ref 0.450–4.500)

## 2022-03-31 ENCOUNTER — Other Ambulatory Visit: Payer: Self-pay

## 2022-03-31 ENCOUNTER — Other Ambulatory Visit: Payer: Self-pay | Admitting: Internal Medicine

## 2022-03-31 MED ORDER — LEVOTHYROXINE SODIUM 50 MCG PO TABS
50.0000 ug | ORAL_TABLET | Freq: Every day | ORAL | 1 refills | Status: DC
  Filled 2022-03-31: qty 90, 90d supply, fill #0

## 2022-03-31 NOTE — Telephone Encounter (Signed)
Please review.  KP

## 2022-04-02 MED ORDER — LEVOTHYROXINE SODIUM 50 MCG PO TABS: 50.0000 ug | ORAL_TABLET | Freq: Every day | ORAL | 1 refills | Status: DC

## 2022-04-02 NOTE — Telephone Encounter (Addendum)
The daughter called in just to make sure the refill for the levothyroxine (SYNTHROID) 50 MCG tablet goes to the  [facility], [place] Phone: [phone]  Fax: [phone]      Patient no longer uses [facility] and does not use [facility]. Please assist patient further as she is completely out of her medication

## 2022-04-02 NOTE — Addendum Note (Signed)
Addended by: Erie Noe on: 04/02/2022 12:19 PM   Modules accepted: Orders

## 2022-04-02 NOTE — Telephone Encounter (Signed)
Sending to different pharmacy as pt doesn't use [facility] pharmacy anymore.   Requested Prescriptions  Pending Prescriptions Disp Refills   levothyroxine (SYNTHROID) 50 MCG tablet 90 tablet 1    Sig: Take 1 tablet (50 mcg total) by mouth daily before breakfast.     Endocrinology:  Hypothyroid Agents Passed - 04/02/2022 12:19 PM      Passed - TSH in normal range and within 360 days    TSH  Date Value Ref Range Status  03/26/2022 1.340 0.450 - 4.500 uIU/mL Final         Passed - Valid encounter within last 12 months    Recent Outpatient Visits           1 week ago GERD without esophagitis   [place] [facility], Jesse Sans, MD   3 months ago Atherosclerosis of native coronary artery of native heart without angina pectoris   [facility] at [facility], Jesse Sans, MD       Future Appointments             In 6 months Glean Hess, MD [facility] at [facility], [facility]            Signed Prescriptions Disp Refills   levothyroxine (SYNTHROID) 50 MCG tablet 90 tablet 1    Sig: Take 1 tablet (50 mcg total) by mouth daily before breakfast.     Endocrinology:  Hypothyroid Agents Passed - 03/31/2022 11:54 AM      Passed - TSH in normal range and within 360 days    TSH  Date Value Ref Range Status  03/26/2022 1.340 0.450 - 4.500 uIU/mL Final         Passed - Valid encounter within last 12 months    Recent Outpatient Visits           1 week ago GERD without esophagitis   [place] [facility] [place] [facility], Jesse Sans, MD   3 months ago Atherosclerosis of native coronary artery of native heart without angina pectoris   [facility] [place] [facility], Jesse Sans, MD       Future Appointments             In 6 months Army Melia, Jesse Sans, MD [place] at  [facility], [facility]

## 2022-04-09 ENCOUNTER — Encounter: Payer: Medicare HMO | Admitting: Cardiology

## 2022-04-09 ENCOUNTER — Other Ambulatory Visit: Payer: Medicare HMO

## 2022-04-22 ENCOUNTER — Encounter: Payer: Self-pay | Admitting: Internal Medicine

## 2022-05-08 DIAGNOSIS — R001 Bradycardia, unspecified: Secondary | ICD-10-CM | POA: Diagnosis not present

## 2022-05-08 DIAGNOSIS — Z950 Presence of cardiac pacemaker: Secondary | ICD-10-CM | POA: Diagnosis not present

## 2022-05-13 ENCOUNTER — Encounter: Payer: Self-pay | Admitting: Internal Medicine

## 2022-05-13 ENCOUNTER — Other Ambulatory Visit: Payer: Self-pay

## 2022-05-13 MED ORDER — OXYBUTYNIN CHLORIDE ER 10 MG PO TB24: 10.0000 mg | ORAL_TABLET | Freq: Every day | ORAL | 1 refills | Status: DC

## 2022-05-14 ENCOUNTER — Ambulatory Visit
Admission: RE | Admit: 2022-05-14 | Discharge: 2022-05-14 | Disposition: A | Discharge status: Home / Self Care | Payer: Medicare HMO | Source: Ambulatory Visit | Attending: Internal Medicine | Admitting: Internal Medicine

## 2022-05-14 DIAGNOSIS — M810 Age-related osteoporosis without current pathological fracture: Secondary | ICD-10-CM | POA: Diagnosis not present

## 2022-05-15 ENCOUNTER — Other Ambulatory Visit: Payer: Self-pay

## 2022-05-15 ENCOUNTER — Telehealth: Payer: Self-pay

## 2022-05-15 DIAGNOSIS — M810 Age-related osteoporosis without current pathological fracture: Secondary | ICD-10-CM

## 2022-05-15 MED ORDER — VITAMIN D2 50 MCG (2000 UT) PO TABS: 2000.0000 [IU] | ORAL_TABLET | ORAL | 1 refills | Status: DC

## 2022-05-15 MED ORDER — VITAMIN D2 50 MCG (2000 UT) PO TABS: 1250.0000 ug | ORAL_TABLET | ORAL | 1 refills | Status: DC

## 2022-05-15 MED ORDER — ALENDRONATE SODIUM 70 MG PO TABS: 70.0000 mg | ORAL_TABLET | ORAL | 0 refills | Status: DC

## 2022-05-15 NOTE — Telephone Encounter (Signed)
Prescription corrected and sent in.  KP

## 2022-05-15 NOTE — Telephone Encounter (Signed)
Received call from [facility] [place] at [facility].   Pharmacy would like clarification of dosage and sig for vitamin D prescription.  Please advise.

## 2022-05-18 NOTE — Progress Notes (Unsigned)
  Electrophysiology Office Note:    Date:  05/21/2022   ID:  Sandra Murphy, DOB 07-Sep-1933, MRN [id number]  [place] Cardiologist:  None  [facility] Electrophysiologist:  Vickie Epley, MD   Referring MD: Minna Merritts, MD   Chief Complaint: Pacemaker  History of Present Illness:    Sandra Murphy is a 89 y.o. female who I am seeing today for an evaluation of a permanent pacemaker at the request of Dr. Rockey Situ.  The patient last saw Dr. Rockey Situ March 10, 2022.  The patient has a history of coronary artery disease post PCI, permanent pacemaker in situ, DVT diagnosed in 2022, anxiety, PVCs and PACs.  The patient is originally from [place] and moved here in November 2023.  She is on sotalol to help manage her palpitations.  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  She moved to [place] to live with her daughter.  She has been doing well.  No problems with her pacemaker or sotalol.       Their past medical, social and family history was reveiwed.   ROS:   Please see the history of present illness.    All other systems reviewed and are negative.  EKGs/Labs/Other Studies Reviewed:    The following studies were reviewed today:  May 21, 2022 in clinic device interrogation personally reviewed Battery longevity 3 years Lead parameter stable One 3-minute episode of atrial tach/atrial fib since July No changes made today and programming 88% atrial pacing  Chest x-ray from November 14, 2020 shows a dual-chamber permanent pacemaker in the left chest with appropriate lead positions.    EKG:  The ekg ordered today demonstrates sinus rhythm.  QTc is 425 ms.   Physical Exam:    VS:  BP 118/70   Pulse 72   Ht 5' (1.524 m)   Wt 175 lb (79.4 kg)   SpO2 98%   BMI 34.18 kg/m     Wt Readings from Last 3 Encounters:  05/21/22 175 lb (79.4 kg)  03/26/22 172 lb 9.6 oz (78.3 kg)  03/10/22 175 lb 4 oz (79.5 kg)      GEN:  Well nourished, well developed in no acute distress CARDIAC: RRR, no murmurs, rubs, gallops.  Pacemaker pocket well-healed RESPIRATORY:  Clear to auscultation without rales, wheezing or rhonchi       ASSESSMENT AND PLAN:    1. Tachy-brady syndrome ([place])   2. Pacemaker   3. History of DVT (deep vein thrombosis)   4. Encounter for long-term (current) use of high-risk medication     #Tachybradycardia #Permanent pacemaker in situ Device functioning appropriately.  Continue remote monitoring with our clinic.  #PACs #PVCs #High risk medication use-sotalol Arrhythmia seems to be well-controlled on sotalol.  QTc acceptable for ongoing sotalol use. Continue the current dose.  #Atrial high rate Very low burden of AT/AF on remote interrogation, would favor continued monitoring.  I would not recommend starting anticoagulation unless the burden of AT/AF were to significantly increase.  Continue sotalol.     Follow-up with EP APP in 4 months for routine sotalol monitoring.       Signed, Hilton Cork. Quentin Ore, MD, [facility], [facility] 05/21/2022 1:37 PM    Electrophysiology [facility]

## 2022-05-19 ENCOUNTER — Encounter: Payer: Self-pay | Admitting: Internal Medicine

## 2022-05-19 ENCOUNTER — Other Ambulatory Visit: Payer: Self-pay

## 2022-05-21 ENCOUNTER — Ambulatory Visit: Payer: Medicare HMO | Attending: Cardiology | Admitting: Cardiology

## 2022-05-21 ENCOUNTER — Encounter: Payer: Self-pay | Admitting: Cardiology

## 2022-05-21 VITALS — BP 118/70 | HR 72 | Ht 60.0 in | Wt 175.0 lb

## 2022-05-21 DIAGNOSIS — Z950 Presence of cardiac pacemaker: Secondary | ICD-10-CM | POA: Diagnosis not present

## 2022-05-21 DIAGNOSIS — Z86718 Personal history of other venous thrombosis and embolism: Secondary | ICD-10-CM | POA: Diagnosis not present

## 2022-05-21 DIAGNOSIS — I495 Sick sinus syndrome: Secondary | ICD-10-CM

## 2022-05-21 DIAGNOSIS — Z79899 Other long term (current) drug therapy: Secondary | ICD-10-CM | POA: Diagnosis not present

## 2022-05-21 NOTE — Patient Instructions (Signed)
Medication Instructions:  Your physician recommends that you continue on your current medications as directed. Please refer to the Current Medication list given to you today.  *If you need a refill on your cardiac medications before your next appointment, please call your pharmacy*  Follow-Up: At [facility], you and your health needs are our priority.  As part of our continuing mission to provide you with exceptional heart care, we have created designated Provider Care Teams.  These Care Teams include your primary Cardiologist (physician) and Advanced Practice Providers (APPs -  Physician Assistants and Nurse Practitioners) who all work together to provide you with the care you need, when you need it.  Your next appointment:   4 month(s)  Provider:   Mamie Levers, NP

## 2022-06-03 ENCOUNTER — Encounter: Payer: Self-pay | Admitting: Internal Medicine

## 2022-06-04 ENCOUNTER — Other Ambulatory Visit: Payer: Self-pay

## 2022-06-04 ENCOUNTER — Other Ambulatory Visit: Payer: Self-pay | Admitting: Internal Medicine

## 2022-06-04 DIAGNOSIS — F411 Generalized anxiety disorder: Secondary | ICD-10-CM

## 2022-06-04 DIAGNOSIS — K219 Gastro-esophageal reflux disease without esophagitis: Secondary | ICD-10-CM

## 2022-06-04 MED ORDER — MIRTAZAPINE 30 MG PO TABS: 30.0000 mg | ORAL_TABLET | Freq: Every day | ORAL | 1 refills | Status: DC

## 2022-06-04 MED ORDER — PANTOPRAZOLE SODIUM 40 MG PO TBEC: 40.0000 mg | DELAYED_RELEASE_TABLET | Freq: Two times a day (BID) | ORAL | 1 refills | Status: DC

## 2022-06-04 MED ORDER — LORAZEPAM 0.5 MG PO TABS: 0.5000 mg | ORAL_TABLET | Freq: Two times a day (BID) | ORAL | 1 refills | Status: DC | PRN

## 2022-06-25 ENCOUNTER — Encounter: Payer: Self-pay | Admitting: Internal Medicine

## 2022-07-23 ENCOUNTER — Encounter: Payer: Self-pay | Admitting: Internal Medicine

## 2022-07-27 ENCOUNTER — Ambulatory Visit
Admission: RE | Admit: 2022-07-27 | Discharge: 2022-07-27 | Disposition: A | Discharge status: Home / Self Care | Payer: Medicare HMO | Source: Ambulatory Visit | Attending: Emergency Medicine | Admitting: Emergency Medicine

## 2022-07-27 ENCOUNTER — Ambulatory Visit (INDEPENDENT_AMBULATORY_CARE_PROVIDER_SITE_OTHER): Payer: Medicare HMO

## 2022-07-27 VITALS — BP 116/80 | HR 72 | Temp 97.7°F | Resp 16 | Ht 60.0 in | Wt 174.0 lb

## 2022-07-27 DIAGNOSIS — K529 Noninfective gastroenteritis and colitis, unspecified: Secondary | ICD-10-CM

## 2022-07-27 DIAGNOSIS — R1010 Upper abdominal pain, unspecified: Secondary | ICD-10-CM | POA: Diagnosis not present

## 2022-07-27 LAB — CBC WITH DIFFERENTIAL/PLATELET
Abs Immature Granulocytes: 0.01 10*3/uL (ref 0.00–0.07)
Basophils Absolute: 0 10*3/uL (ref 0.0–0.1)
Basophils Relative: 0 %
Eosinophils Absolute: 0 10*3/uL (ref 0.0–0.5)
Eosinophils Relative: 0 %
HCT: 41.5 % (ref 36.0–46.0)
Hemoglobin: 14.2 g/dL (ref 12.0–15.0)
Immature Granulocytes: 0 %
Lymphocytes Relative: 30 %
Lymphs Abs: 1.9 10*3/uL (ref 0.7–4.0)
MCH: 30.6 pg (ref 26.0–34.0)
MCHC: 34.2 g/dL (ref 30.0–36.0)
MCV: 89.4 fL (ref 80.0–100.0)
Monocytes Absolute: 0.5 10*3/uL (ref 0.1–1.0)
Monocytes Relative: 8 %
Neutro Abs: 3.9 10*3/uL (ref 1.7–7.7)
Neutrophils Relative %: 62 %
Platelets: 248 10*3/uL (ref 150–400)
RBC: 4.64 MIL/uL (ref 3.87–5.11)
RDW: 13 % (ref 11.5–15.5)
WBC: 6.4 10*3/uL (ref 4.0–10.5)
nRBC: 0 % (ref 0.0–0.2)

## 2022-07-27 LAB — COMPREHENSIVE METABOLIC PANEL
ALT: 15 U/L (ref 0–44)
AST: 19 U/L (ref 15–41)
Albumin: 4.4 g/dL (ref 3.5–5.0)
Alkaline Phosphatase: 66 U/L (ref 38–126)
Anion gap: 10 (ref 5–15)
BUN: 14 mg/dL (ref 8–23)
CO2: 25 mmol/L (ref 22–32)
Calcium: 8.9 mg/dL (ref 8.9–10.3)
Chloride: 105 mmol/L (ref 98–111)
Creatinine, Ser: 0.95 mg/dL (ref 0.44–1.00)
GFR, Estimated: 58 mL/min — ABNORMAL LOW (ref >60)
Glucose, Bld: 104 mg/dL — ABNORMAL HIGH (ref 70–99)
Potassium: 4 mmol/L (ref 3.5–5.1)
Sodium: 140 mmol/L (ref 135–145)
Total Bilirubin: 0.6 mg/dL (ref 0.3–1.2)
Total Protein: 7.2 g/dL (ref 6.5–8.1)

## 2022-07-27 MED ORDER — ONDANSETRON 8 MG PO TBDP
8.0000 mg | ORAL_TABLET | Freq: Once | ORAL | Status: AC
  Administered 2022-07-27: 8 mg via ORAL

## 2022-07-27 MED ORDER — ONDANSETRON 8 MG PO TBDP: 8.0000 mg | ORAL_TABLET | Freq: Three times a day (TID) | ORAL | 0 refills | Status: DC | PRN

## 2022-07-27 NOTE — ED Provider Notes (Signed)
[facility]    CSN: [id number] Arrival date & time: 07/27/22  1401      History   Chief Complaint Chief Complaint  Patient presents with   Emesis   Nausea   Diarrhea   Fatigue    HPI Sandra Murphy is a 89 y.o. female.   HPI  89 year old female with a past medical history of mixed hyperlipidemia, atherosclerotic heart disease, asthma, GERD, hypothyroidism, PVD, and hiatal hernia presents for evaluation of nausea, vomiting, and diarrhea with mid upper abdominal pain that started 5 days ago.  She states that every time she eats she vomits.  She is able to keep down fluids.  She does feel weak and fatigued but no dizziness.  She has tried Pepto-Bismol and Imodium without relief.  Past Medical History:  Diagnosis Date   CAD (coronary artery disease)    stents to proximal LAD and mid RCA   Hyperlipidemia    Hypertension    Hypothyroidism    IBS (irritable bowel syndrome)    Osteoarthritis    PVD (peripheral vascular disease) (HCC)    SVT (supraventricular tachycardia)     Patient Active Problem List   Diagnosis Date Noted   Tachy-brady syndrome (HCC) 12/04/2021   GERD without esophagitis 12/04/2021   Generalized anxiety disorder 12/04/2021   Degenerative disc disease, lumbar 12/04/2021   OAB (overactive bladder) 12/04/2021   Acquired hypothyroidism 12/04/2021   Mixed hyperlipidemia 08/08/2021   Hx of deep venous thrombosis 08/08/2021   Osteoporosis 08/08/2021   PVD (peripheral vascular disease) (HCC) 08/08/2021   Hx of heart artery stent 08/08/2021   Hiatal hernia 08/08/2021   Asthma 11/14/2020   Atherosclerotic heart disease of native coronary artery without angina pectoris 01/10/2016   PVCs (premature ventricular contractions) 01/10/2016    Past Surgical History:  Procedure Laterality Date   ABDOMINAL HYSTERECTOMY     ANTERIOR FUSION CERVICAL SPINE     twice   CHOLECYSTECTOMY     CORONARY ANGIOPLASTY WITH STENT PLACEMENT     proximal LAD and mid  RCA   LUMBAR DISC SURGERY     Pace maker  11/06/2016   Medtronic Boston Scietific Pacemaker Model L311/ Serial [id number]   THYROIDECTOMY Left     OB History   No obstetric history on file.      Home Medications    Prior to Admission medications   Medication Sig Start Date End Date Taking? Authorizing Provider  alendronate (FOSAMAX) 70 MG tablet Take 1 tablet (70 mg total) by mouth once a week. 05/15/22  Yes Reubin Milan, MD  Aspirin 81 MG CAPS Take 1 tablet by mouth daily.   Yes [provider]  Calcium 200 MG TABS Take 600 mg by mouth 2 (two) times daily.   Yes [provider]  Cholecalciferol (VITAMIN D3) 250 MCG (10000 UT) CAPS Take by mouth.   Yes [provider]  clopidogrel (PLAVIX) 75 MG tablet Take 1 tablet (75 mg total) by mouth daily. 03/10/22  Yes Antonieta Iba, MD  levothyroxine (SYNTHROID) 50 MCG tablet Take 1 tablet (50 mcg total) by mouth daily before breakfast. 04/02/22  Yes Reubin Milan, MD  LORazepam (ATIVAN) 0.5 MG tablet Take 1 tablet (0.5 mg total) by mouth 2 (two) times daily as needed for anxiety. 06/04/22  Yes Reubin Milan, MD  mirtazapine (REMERON) 30 MG tablet Take 1 tablet (30 mg total) by mouth daily. 06/04/22  Yes Reubin Milan, MD  NIFEdipine (PROCARDIA-XL/NIFEDICAL-XL) 30 MG 24  hr tablet Take 1 tablet (30 mg) by mouth once daily 03/10/22  Yes Gollan, Tollie Pizza, MD  nitroGLYCERIN (NITROSTAT) 0.4 MG SL tablet Place under the tongue.   Yes [provider]  ondansetron (ZOFRAN-ODT) 8 MG disintegrating tablet Take 1 tablet (8 mg total) by mouth every 8 (eight) hours as needed for nausea or vomiting. 07/27/22  Yes Becky Augusta, NP  oxybutynin (DITROPAN-XL) 10 MG 24 hr tablet Take 1 tablet (10 mg total) by mouth daily. 05/13/22  Yes Reubin Milan, MD  pantoprazole (PROTONIX) 40 MG tablet Take 1 tablet (40 mg total) by mouth 2 (two) times daily. 06/04/22  Yes Reubin Milan, MD  pravastatin (PRAVACHOL) 40 MG  tablet Take 1 tablet (40 mg total) by mouth daily. 03/10/22  Yes Gollan, Tollie Pizza, MD  sacubitril-valsartan (ENTRESTO) 24-26 MG Take 1 tablet by mouth 2 (two) times daily. 03/10/22  Yes Gollan, Tollie Pizza, MD  SOTALOL AF 80 MG TABS Take 1 tablet (80 mg total) by mouth 2 (two) times daily. 03/10/22  Yes Gollan, Tollie Pizza, MD    Family History Family History  Problem Relation Age of Onset   Cancer Mother    Hearing loss Father    Heart attack Father    Hypertension Sister    Heart disease Sister    Diabetes Sister    Pancreatic cancer Sister    Liver cancer Sister    Heart disease Brother    Lung disease Brother     Social History Social History   Tobacco Use   Smoking status: Never   Smokeless tobacco: Never  Vaping Use   Vaping Use: Never used  Substance Use Topics   Alcohol use: No   Drug use: Never     Allergies   Penicillins, Other, and Sulfa antibiotics   Review of Systems Review of Systems  Constitutional:  Positive for fatigue. Negative for fever.  Gastrointestinal:  Positive for abdominal pain, diarrhea, nausea and vomiting.     Physical Exam Triage Vital Signs ED Triage Vitals  Enc Vitals Group     BP 07/27/22 1408 116/80     Pulse Rate 07/27/22 1408 72     Resp 07/27/22 1408 16     Temp 07/27/22 1408 97.7 F (36.5 C)     Temp Source 07/27/22 1408 Oral     SpO2 07/27/22 1408 98 %     Weight 07/27/22 1408 174 lb (78.9 kg)     Height 07/27/22 1408 5' (1.524 m)     Head Circumference --      Peak Flow --      Pain Score 07/27/22 1413 8     Pain Loc --      Pain Edu? --      Excl. in GC? --    No data found.  Updated Vital Signs BP 116/80 (BP Location: Left Arm)   Pulse 72   Temp 97.7 F (36.5 C) (Oral)   Resp 16   Ht 5' (1.524 m)   Wt 174 lb (78.9 kg)   SpO2 98%   BMI 33.98 kg/m   Visual Acuity Right Eye Distance:   Left Eye Distance:   Bilateral Distance:    Right Eye Near:   Left Eye Near:    Bilateral Near:     Physical  Exam Vitals and nursing note reviewed.  Constitutional:      Appearance: Normal appearance. She is ill-appearing.  HENT:     Head: Normocephalic and atraumatic.  Mouth/Throat:     Mouth: Mucous membranes are moist.     Pharynx: Oropharynx is clear.     Comments: Patient has dry lips and sticky oral mucous membranes. Eyes:     General: No scleral icterus.    Extraocular Movements: Extraocular movements intact.     Pupils: Pupils are equal, round, and reactive to light.     Comments: Sclera are dull and tacky.  Cardiovascular:     Rate and Rhythm: Normal rate and regular rhythm.     Pulses: Normal pulses.     Heart sounds: Normal heart sounds. No murmur heard.    No friction rub. No gallop.  Pulmonary:     Effort: Pulmonary effort is normal.     Breath sounds: Normal breath sounds. No wheezing, rhonchi or rales.  Abdominal:     General: There is no distension.     Palpations: Abdomen is soft.     Tenderness: There is no abdominal tenderness. There is no guarding or rebound.  Skin:    General: Skin is warm and dry.     Capillary Refill: Capillary refill takes less than 2 seconds.  Neurological:     General: No focal deficit present.     Mental Status: She is alert and oriented to person, place, and time.      UC Treatments / Results  Labs (all labs ordered are listed, but only abnormal results are displayed) Labs Reviewed  CBC WITH DIFFERENTIAL/PLATELET  COMPREHENSIVE METABOLIC PANEL    EKG   Radiology DG Abd 2 Views  Result Date: 07/27/2022 CLINICAL DATA:  Nausea, vomiting and diarrhea for 5 days. Mid to upper abdominal pain. EXAM: ABDOMEN - 2 VIEW COMPARISON:  None Available. FINDINGS: Normal bowel gas pattern. Mild generalized increase in the colonic stool burden. No air-fluid levels. No free air. Previous cholecystectomy.  No evidence of renal or ureteral stones. Previous spinous process fusions at L4 and L5. No acute skeletal abnormality. IMPRESSION: 1. No acute  findings.  No bowel obstruction or free air. 2. Mild generalized increase in the colonic stool burden. Electronically Signed   By: Amie Portland M.D.   On: 07/27/2022 15:12    Procedures Procedures (including critical care time)  Medications Ordered in UC Medications  ondansetron (ZOFRAN-ODT) disintegrating tablet 8 mg (has no administration in time range)    Initial Impression / Assessment and Plan / UC Course  I have reviewed the triage vital signs and the nursing notes.  Pertinent labs & imaging results that were available during my care of the patient were reviewed by me and considered in my medical decision making (see chart for details).   Patient is a nontoxic-appearing 89 year old female presenting for evaluation of 5 days worth of abdominal pain with nausea, vomiting, diarrhea.  The abdominal pain only happens before she has to vomit.  The pain is at the top of her abdomen up under her rib cage.  She states she does vomit every time she eats but she has been able to keep down fluid and soda.  On exam she does have dull and tacky sclera and sticky oral mucous membranes but she has not had any dizziness and her vital signs are unremarkable.  Her abdomen is soft and nontender to palpation.  Given that she has had 5 days of vomiting and diarrhea I will order blood work to evaluate her electrolyte levels and also 2 view abdomen to evaluate her hiatal hernia and rule out any other potential source such  as developing small bowel obstruction.  CBC shows a normal white count of 6.4, normal H&H of 14.2 and 41.5, and normal platelets of 248.  No abnormalities to differential.  Radiology impression of 2 view abdomen states there is no acute findings.  Mild generalized increase in colonic stool burden.  Final Clinical Impressions(s) / UC Diagnoses   Final diagnoses:  Gastroenteritis     Discharge Instructions      Take the Zofran every 8 hours as needed for nausea and vomiting.  They are  an oral disintegrating tablet and you can place them on her under your tongue and then will be absorbed.  Follow a clear liquid diet for the next 6 to 12 hours.  Clear liquids consist of broth, ginger ale, water, Pedialyte, and Jell-O.  After 6 to 12 hours, if you are tolerating clear liquids, you can advance to bland foods such as bananas, rice, applesauce, and toast.  If you tolerate bland foods you can continue to advance your diet as you see fit.  If you develop a fever over 100.5, increased abdominal pain, bloody vomit, or bloody stool return for reevaluation or go to the ER.      ED Prescriptions     Medication Sig Dispense Auth. Provider   ondansetron (ZOFRAN-ODT) 8 MG disintegrating tablet Take 1 tablet (8 mg total) by mouth every 8 (eight) hours as needed for nausea or vomiting. 20 tablet Becky Augusta, NP      PDMP not reviewed this encounter.   Becky Augusta, NP 07/27/22 1538

## 2022-07-27 NOTE — ED Triage Notes (Signed)
Pt c/o N/V/D x5 days & mid upper abd pain. States hx of acid reflux & hernia, unable to keep food or fluids down. Feels very weak & fatigued. Has tried Pepto & imodium w/o relief.

## 2022-07-27 NOTE — Discharge Instructions (Addendum)
Take the Zofran every 8 hours as needed for nausea and vomiting.  They are an oral disintegrating tablet and you can place them on her under your tongue and then will be absorbed. ° °Follow a clear liquid diet for the next 6 to 12 hours.  Clear liquids consist of broth, ginger ale, water, Pedialyte, and Jell-O. ° °After 6 to 12 hours, if you are tolerating clear liquids, you can advance to bland foods such as bananas, rice, applesauce, and toast.  If you tolerate bland foods you can continue to advance your diet as you see fit. ° °If you develop a fever over 100.5, increased abdominal pain, bloody vomit, or bloody stool return for reevaluation or go to the ER.  °

## 2022-08-07 ENCOUNTER — Other Ambulatory Visit: Payer: Self-pay | Admitting: Internal Medicine

## 2022-08-07 DIAGNOSIS — M810 Age-related osteoporosis without current pathological fracture: Secondary | ICD-10-CM

## 2022-08-07 MED ORDER — ALENDRONATE SODIUM 70 MG PO TABS
70.0000 mg | ORAL_TABLET | ORAL | 0 refills | Status: DC
Start: 2022-08-07 — End: 2022-10-28

## 2022-08-21 ENCOUNTER — Ambulatory Visit (INDEPENDENT_AMBULATORY_CARE_PROVIDER_SITE_OTHER): Payer: Medicare HMO

## 2022-08-21 DIAGNOSIS — I495 Sick sinus syndrome: Secondary | ICD-10-CM | POA: Diagnosis not present

## 2022-08-26 ENCOUNTER — Other Ambulatory Visit: Payer: Self-pay | Admitting: Internal Medicine

## 2022-08-26 DIAGNOSIS — F411 Generalized anxiety disorder: Secondary | ICD-10-CM

## 2022-08-26 LAB — CUP PACEART REMOTE DEVICE CHECK
Battery Remaining Longevity: 36 mo
Battery Remaining Percentage: 61 %
Brady Statistic RA Percent Paced: 89 %
Brady Statistic RV Percent Paced: 3 %
Date Time Interrogation Session: 20240627015100
Implantable Lead Connection Status: 753985
Implantable Lead Connection Status: 753985
Implantable Lead Implant Date: 20180913
Implantable Lead Implant Date: 20180913
Implantable Lead Location: 753859
Implantable Lead Location: 753860
Implantable Lead Model: 4473
Implantable Lead Model: 7742
Implantable Lead Serial Number: 497240
Implantable Lead Serial Number: 841204
Implantable Pulse Generator Implant Date: 20180913
Lead Channel Impedance Value: 399 Ohm
Lead Channel Impedance Value: 894 Ohm
Lead Channel Pacing Threshold Amplitude: 0.4 V
Lead Channel Pacing Threshold Amplitude: 1 V
Lead Channel Pacing Threshold Pulse Width: 0.4 ms
Lead Channel Pacing Threshold Pulse Width: 0.4 ms
Lead Channel Setting Pacing Amplitude: 2 V
Lead Channel Setting Pacing Amplitude: 2 V
Lead Channel Setting Pacing Pulse Width: 0.4 ms
Lead Channel Setting Sensing Sensitivity: 2.5 mV
Pulse Gen Serial Number: 374129
Zone Setting Status: 755011

## 2022-09-04 NOTE — Progress Notes (Signed)
Remote pacemaker transmission.   

## 2022-09-09 ENCOUNTER — Encounter: Payer: Self-pay | Admitting: Internal Medicine

## 2022-09-10 DIAGNOSIS — N3001 Acute cystitis with hematuria: Secondary | ICD-10-CM | POA: Diagnosis not present

## 2022-09-10 DIAGNOSIS — Z6834 Body mass index (BMI) 34.0-34.9, adult: Secondary | ICD-10-CM | POA: Diagnosis not present

## 2022-09-22 ENCOUNTER — Encounter: Payer: Medicare HMO | Admitting: Cardiology

## 2022-09-26 ENCOUNTER — Other Ambulatory Visit: Payer: Self-pay | Admitting: Internal Medicine

## 2022-09-26 DIAGNOSIS — F411 Generalized anxiety disorder: Secondary | ICD-10-CM

## 2022-09-26 NOTE — Telephone Encounter (Signed)
Please review.  KP

## 2022-09-27 MED ORDER — LORAZEPAM 0.5 MG PO TABS
0.5000 mg | ORAL_TABLET | Freq: Two times a day (BID) | ORAL | 0 refills | Status: DC | PRN
Start: 2022-09-27 — End: 2022-10-28

## 2022-09-29 ENCOUNTER — Other Ambulatory Visit: Payer: Self-pay | Admitting: Internal Medicine

## 2022-09-29 DIAGNOSIS — F411 Generalized anxiety disorder: Secondary | ICD-10-CM

## 2022-10-06 ENCOUNTER — Ambulatory Visit: Payer: Medicare HMO | Admitting: Cardiology

## 2022-10-06 NOTE — Progress Notes (Unsigned)
Cardiology Office Note Date:  10/07/2022  Patient ID:  Sandra Murphy, Sandra Murphy 1933/03/17, MRN [id number] PCP:  Reubin Milan, MD  Cardiologist:  None Electrophysiologist: Lanier Prude, MD   Chief Complaint: routine sotalol follow-up  History of Present Illness: Sandra Murphy is a 89 y.o. female with PMH notable for CAD s/p PCI, tachy-brady s/p PPM, PVCs, PACs, h/o DVT (2022), SCAF, anxiety; seen today for Lanier Prude, MD for routine electrophysiology followup.  She last saw Dr. Lalla Brothers 04/2022 to estabish PPM care with our office after moving to the area from [place] to be near family. She is on sotalol for palpitations, PACs, PVCs.  Was doing well at that time. Had brief SCAF episodes by device, not on OAC. No plans to start [facility] unless significantly higher burden.  On follow-up today, patient overall feels well. She has brief palpitation episodes that last about 5 minutes less than once / week. She does not have chest pain, chest pressure, or SOB with episodes.  She diligently takes sotalol BID, no missed doses.  She checks BP at home, but is unable to recall measurements. She has lower extremity swelling, usually keeps legs propped up throughout the day. She is not particularly active, enjoys being retired. She does eat out at restaurants a good amount.     she denies chest pain, PND, orthopnea, nausea, vomiting, dizziness, syncope, weight gain, or early satiety.     Device Information: Bos Sci dual chamber PPM, imp 10/2016  AAD History: Sotalol   Past Medical History:  Diagnosis Date   CAD (coronary artery disease)    stents to proximal LAD and mid RCA   Hyperlipidemia    Hypertension    Hypothyroidism    IBS (irritable bowel syndrome)    Osteoarthritis    PVD (peripheral vascular disease) (HCC)    SVT (supraventricular tachycardia)     Past Surgical History:  Procedure Laterality Date   ABDOMINAL HYSTERECTOMY     ANTERIOR FUSION CERVICAL SPINE     twice    CHOLECYSTECTOMY     CORONARY ANGIOPLASTY WITH STENT PLACEMENT     proximal LAD and mid RCA   LUMBAR DISC SURGERY     Pace maker  11/06/2016   Medtronic Boston Scietific Pacemaker Model L311/ Serial [id number]   THYROIDECTOMY Left     Current Outpatient Medications  Medication Instructions   alendronate (FOSAMAX) 70 mg, Oral, Weekly   Aspirin 81 MG CAPS 1 tablet, Oral, Daily   Calcium 600 mg, Oral, 2 times daily   Cholecalciferol (VITAMIN D3) 250 MCG (10000 UT) CAPS Oral   clopidogrel (PLAVIX) 75 mg, Oral, Daily   levothyroxine (SYNTHROID) 50 mcg, Oral, Daily before breakfast   LORazepam (ATIVAN) 0.5 mg, Oral, 2 times daily PRN, for anxiety   mirtazapine (REMERON) 30 mg, Oral, Daily   NIFEdipine (PROCARDIA-XL/NIFEDICAL-XL) 30 MG 24 hr tablet Take 1 tablet (30 mg) by mouth once daily   nitroGLYCERIN (NITROSTAT) 0.4 MG SL tablet Sublingual   ondansetron (ZOFRAN-ODT) 8 mg, Oral, Every 8 hours PRN   oxybutynin (DITROPAN-XL) 10 mg, Oral, Daily   pantoprazole (PROTONIX) 40 mg, Oral, 2 times daily   pravastatin (PRAVACHOL) 40 mg, Oral, Daily   sacubitril-valsartan (ENTRESTO) 24-26 MG 1 tablet, Oral, 2 times daily   SOTALOL AF 80 mg, Oral, 2 times daily    Social History:  The patient  reports that she has never smoked. She has never used smokeless tobacco. She reports that she does not drink alcohol  and does not use drugs.   Family History:  The patient's family history includes Cancer in her mother; Diabetes in her sister; Hearing loss in her father; Heart attack in her father; Heart disease in her brother and sister; Hypertension in her sister; Liver cancer in her sister; Lung disease in her brother; Pancreatic cancer in her sister.  ROS:  Please see the history of present illness. All other systems are reviewed and otherwise negative.   PHYSICAL EXAM:  VS:  BP (!) 149/85 (BP Location: Left Arm, Patient Position: Sitting, Cuff Size: Normal)   Pulse 71   Ht 5' (1.524 m)   Wt 177 lb 12.8  oz (80.6 kg)   SpO2 97%   BMI 34.72 kg/m  BMI: Body mass index is 34.72 kg/m.  GEN- The patient is well appearing, alert and oriented x 3 today.   Lungs- Clear to ausculation bilaterally, normal work of breathing.  Heart- Regular rate and rhythm, no murmurs, rubs or gallops Extremities- 2-3+ peripheral edema, warm, dry Skin-   device pocket well-healed, no tethering   Device interrogation done today and reviewed by myself:  Battery 3 years Lead thresholds, impedence, sensing stable  < 1% AFib burden No changes made today  EKG is ordered. Personal review of EKG from today shows:    EKG Interpretation Date/Time:  Tuesday October 07 2022 14:38:13 EDT Ventricular Rate:  71 PR Interval:  260 QRS Duration:  80 QT Interval:  440 QTC Calculation: 478 R Axis:   1  Text Interpretation: Atrial-paced rhythm with prolonged AV conduction Nonspecific ST and T wave abnormality When compared with ECG of 14-Nov-2020 12:39, Electronic atrial pacemaker has replaced Sinus rhythm Confirmed by Sherie Don [phone]) on 10/07/2022 2:47:17 PM    Prev QTc  Recent Labs: 03/26/2022: TSH 1.340 07/27/2022: ALT 15; BUN 14; Creatinine, Ser 0.95; Hemoglobin 14.2; Platelets 248; Potassium 4.0; Sodium 140  03/26/2022: Chol/HDL Ratio 3.4; Cholesterol, Total 166; HDL 49; LDL Chol Calc (NIH) 83; Triglycerides 205   CrCl cannot be calculated (Patient's most recent lab result is older than the maximum 21 days allowed.).   Wt Readings from Last 3 Encounters:  10/07/22 177 lb 12.8 oz (80.6 kg)  07/27/22 174 lb (78.9 kg)  05/21/22 175 lb (79.4 kg)     Additional studies reviewed include: Previous EP, cardiology notes.     ASSESSMENT AND PLAN:  #) tachy-brady syndome #) Bos Sci dual chamber PPM in situ Device functioning well, see paceart for details  #) PACs #) PVCs EKG with stable intervals on sotalol 80mg  BID Electrolytes stable on 07/2022 labs PVC burden much reduced by device (111.6k >  43.8k)  #) SCAF Continues to have <1% AF burden with short, brief episodes Continue to monitor No OAC at this time  #) increased lower extremity swelling Update echo Recommend she reduce dietary sodium intake, review nutrition labs when able Increase walking to 10-15 min / day   #) HTN Elevated in office today.  Recommend checking blood pressures 3-4 times per week at home and recording the values.          Current medicines are reviewed at length with the patient today.   The patient does not have concerns regarding her medicines.  The following changes were made today:  none  Labs/ tests ordered today include:  Orders Placed This Encounter  Procedures   EKG 12-Lead   ECHOCARDIOGRAM COMPLETE     Disposition: Follow up with  EP APP in 3 months  Signed, Sherie Don, NP  10/07/22  2:47 PM  Electrophysiology [facility]

## 2022-10-07 ENCOUNTER — Encounter: Payer: Self-pay | Admitting: Cardiology

## 2022-10-07 ENCOUNTER — Ambulatory Visit: Payer: Medicare HMO | Attending: Cardiology | Admitting: Cardiology

## 2022-10-07 VITALS — BP 149/85 | HR 71 | Ht 60.0 in | Wt 177.8 lb

## 2022-10-07 DIAGNOSIS — R609 Edema, unspecified: Secondary | ICD-10-CM

## 2022-10-07 DIAGNOSIS — I493 Ventricular premature depolarization: Secondary | ICD-10-CM | POA: Diagnosis not present

## 2022-10-07 DIAGNOSIS — Z79899 Other long term (current) drug therapy: Secondary | ICD-10-CM | POA: Diagnosis not present

## 2022-10-07 DIAGNOSIS — I495 Sick sinus syndrome: Secondary | ICD-10-CM

## 2022-10-07 DIAGNOSIS — Z950 Presence of cardiac pacemaker: Secondary | ICD-10-CM | POA: Diagnosis not present

## 2022-10-07 LAB — CUP PACEART INCLINIC DEVICE CHECK
Date Time Interrogation Session: 20240813163951
Implantable Lead Connection Status: 753985
Implantable Lead Connection Status: 753985
Implantable Lead Implant Date: 20180913
Implantable Lead Implant Date: 20180913
Implantable Lead Location: 753859
Implantable Lead Location: 753860
Implantable Lead Model: 4473
Implantable Lead Model: 7742
Implantable Lead Serial Number: 497240
Implantable Lead Serial Number: 841204
Implantable Pulse Generator Implant Date: 20180913
Lead Channel Impedance Value: 395 Ohm
Lead Channel Impedance Value: 922 Ohm
Lead Channel Pacing Threshold Amplitude: 0.4 V
Lead Channel Pacing Threshold Amplitude: 0.8 V
Lead Channel Pacing Threshold Pulse Width: 0.4 ms
Lead Channel Pacing Threshold Pulse Width: 0.4 ms
Lead Channel Sensing Intrinsic Amplitude: 2.8 mV
Lead Channel Sensing Intrinsic Amplitude: 25 mV
Lead Channel Setting Pacing Amplitude: 2 V
Lead Channel Setting Pacing Amplitude: 2 V
Lead Channel Setting Pacing Pulse Width: 0.4 ms
Lead Channel Setting Sensing Sensitivity: 2.5 mV
Pulse Gen Serial Number: 374129
Zone Setting Status: 755011

## 2022-10-07 NOTE — Patient Instructions (Addendum)
Medication Instructions:  No changes *If you need a refill on your cardiac medications before your next appointment, please call your pharmacy*   Lab Work: None ordered If you have labs (blood work) drawn today and your tests are completely normal, you will receive your results only by: MyChart Message (if you have MyChart) OR A paper copy in the mail If you have any lab test that is abnormal or we need to change your treatment, we will call you to review the results.   Testing/Procedures: Your physician has requested that you have an echocardiogram. Echocardiography is a painless test that uses sound waves to create images of your heart. It provides your doctor with information about the size and shape of your heart and how well your heart's chambers and valves are working.   You may receive an ultrasound enhancing agent through an IV if needed to better visualize your heart during the echo. This procedure takes approximately one hour.  There are no restrictions for this procedure.  This will take place at [address] [address] [facility], [place] [address]    Follow-Up: At [facility], you and your health needs are our priority.  As part of our continuing mission to provide you with exceptional heart care, we have created designated Provider Care Teams.  These Care Teams include your primary Cardiologist (physician) and Advanced Practice Providers (APPs -  Physician Assistants and Nurse Practitioners) who all work together to provide you with the care you need, when you need it.  We recommend signing up for the patient portal called "MyChart".  Sign up information is provided on this After Visit Summary.  MyChart is used to connect with patients for Virtual Visits (Telemedicine).  Patients are able to view lab/test results, encounter notes, upcoming appointments, etc.  Non-urgent messages can be sent to your provider as well.   To learn more about what you can  do with MyChart, go to [email or web address].    Your next appointment:   3 month(s)  Provider:   Sherie Don, NP    Other Instructions Please check your blood pressure 3-4 weekly. Keep a journal of these daily blood pressure and heart rate readings. Thank you!  It is best to check your BP 1-2 hours after taking your medications to see the medications effectiveness on your BP.    Here are some tips that our clinical pharmacists share for home BP monitoring:          Rest 10 minutes before taking your blood pressure.          Don't smoke or drink caffeinated beverages for at least 30 minutes before.          Take your blood pressure before (not after) you eat.          Sit comfortably with your back supported and both feet on the floor (don't cross your legs).          Elevate your arm to heart level on a table or a desk.          Use the proper sized cuff. It should fit smoothly and snugly around your bare upper arm. There should be enough room to slip a fingertip under the cuff. The bottom edge of the cuff should be 1 inch above the crease of the elbow.

## 2022-10-08 ENCOUNTER — Ambulatory Visit (INDEPENDENT_AMBULATORY_CARE_PROVIDER_SITE_OTHER): Payer: Medicare HMO | Admitting: Internal Medicine

## 2022-10-08 ENCOUNTER — Encounter: Payer: Self-pay | Admitting: Internal Medicine

## 2022-10-08 VITALS — BP 122/79 | HR 70 | Ht 60.0 in | Wt 176.0 lb

## 2022-10-08 DIAGNOSIS — E039 Hypothyroidism, unspecified: Secondary | ICD-10-CM | POA: Diagnosis not present

## 2022-10-08 DIAGNOSIS — I10 Essential (primary) hypertension: Secondary | ICD-10-CM

## 2022-10-08 DIAGNOSIS — Z0000 Encounter for general adult medical examination without abnormal findings: Secondary | ICD-10-CM

## 2022-10-08 DIAGNOSIS — Z1231 Encounter for screening mammogram for malignant neoplasm of breast: Secondary | ICD-10-CM | POA: Diagnosis not present

## 2022-10-08 DIAGNOSIS — K219 Gastro-esophageal reflux disease without esophagitis: Secondary | ICD-10-CM | POA: Diagnosis not present

## 2022-10-08 DIAGNOSIS — R1319 Other dysphagia: Secondary | ICD-10-CM | POA: Diagnosis not present

## 2022-10-08 DIAGNOSIS — Z23 Encounter for immunization: Secondary | ICD-10-CM

## 2022-10-08 DIAGNOSIS — F411 Generalized anxiety disorder: Secondary | ICD-10-CM | POA: Diagnosis not present

## 2022-10-08 DIAGNOSIS — E782 Mixed hyperlipidemia: Secondary | ICD-10-CM

## 2022-10-08 NOTE — Assessment & Plan Note (Signed)
Clinically stable on current regimen with good control of symptoms, No SI or HI. No change in management at this time.  

## 2022-10-08 NOTE — Assessment & Plan Note (Signed)
LDL is  Lab Results  Component Value Date   LDLCALC 83 03/26/2022   Currently being treated with pravachol with good compliance and no concerns.

## 2022-10-08 NOTE — Assessment & Plan Note (Signed)
BP has been running slightly high despite Entresto, Solotol and Nifedipine Seeing cardiology - ECHO planned

## 2022-10-08 NOTE — Assessment & Plan Note (Signed)
Reflux controlled on PPI but having some dysphagia and emesis after eating recently. Continue PPI Will order Barium swallow

## 2022-10-08 NOTE — Progress Notes (Signed)
Date:  10/08/2022   Name:  Sandra Murphy   DOB:  1933/11/29   MRN:  [id number]   Chief Complaint: Annual Exam, Ear Pain (Right ear pain on and off.), and Vomiting (Patient is c/o issues with eating certain foods and having vomiting and diarrhea at the same time. This has been going on for 6 months now. Happens about once a month.) Sandra Murphy is a 89 y.o. female who presents today for her Complete Annual Exam. She feels fairly well. She reports exercising none. She reports she is sleeping fairly well. Breast complaints none.  Mammogram: 12/2021 right cyst aspirated DEXA: osteoporosis Colonoscopy: aged out  Health Maintenance Due  Topic Date Due   COVID-19 Vaccine (6 - 2023-24 season) 10/25/2021   INFLUENZA VACCINE  09/25/2022    Immunization History  Administered Date(s) Administered   COVID-19, mRNA, vaccine(Comirnaty)12 years and older 03/09/2019, 03/17/2019, 04/04/2019   Fluad Quad(high Dose 65+) 11/13/2021   Influenza,inj,Quad PF,6+ Mos 11/10/2016, 11/10/2017, 01/17/2020   Moderna Sars-Covid-2 Vaccination 03/17/2019, 04/04/2019   PNEUMOCOCCAL CONJUGATE-20 10/08/2022   Pneumococcal Conjugate-13 02/10/2015   Pneumococcal Polysaccharide-23 02/25/2007   Tdap 01/16/2022   Zoster Recombinant(Shingrix) 11/13/2021, 01/16/2022    Thyroid Problem Presents for follow-up visit. Symptoms include palpitations. Patient reports no anxiety, constipation, diarrhea, fatigue or tremors. The symptoms have been stable. Her past medical history is significant for hyperlipidemia.  Hyperlipidemia This is a chronic problem. The problem is controlled. Associated symptoms include shortness of breath. Pertinent negatives include no chest pain. Current antihyperlipidemic treatment includes statins. The current treatment provides significant improvement of lipids.  Gastroesophageal Reflux She complains of dysphagia (about once a month has to vomit up food that she just ate) and globus sensation. She  reports no abdominal pain, no chest pain, no coughing, no heartburn or no wheezing. Pertinent negatives include no fatigue.  Anxiety Presents for follow-up visit. Symptoms include palpitations and shortness of breath. Patient reports no chest pain, dizziness or nervous/anxious behavior.    CAD and Tachy-brady syndrome - followed closely by Cardiology. ECHO done yesterday.  On Entresto and doing fairly well.  Very sedentary lifestyle.   Lab Results  Component Value Date   NA 140 07/27/2022   K 4.0 07/27/2022   CO2 25 07/27/2022   GLUCOSE 104 (H) 07/27/2022   BUN 14 07/27/2022   CREATININE 0.95 07/27/2022   CALCIUM 8.9 07/27/2022   EGFR 69 03/26/2022   GFRNONAA 58 (L) 07/27/2022   Lab Results  Component Value Date   CHOL 166 03/26/2022   HDL 49 03/26/2022   LDLCALC 83 03/26/2022   TRIG 205 (H) 03/26/2022   CHOLHDL 3.4 03/26/2022   Lab Results  Component Value Date   TSH 1.340 03/26/2022   No results found for: "HGBA1C" Lab Results  Component Value Date   WBC 6.4 07/27/2022   HGB 14.2 07/27/2022   HCT 41.5 07/27/2022   MCV 89.4 07/27/2022   PLT 248 07/27/2022   Lab Results  Component Value Date   ALT 15 07/27/2022   AST 19 07/27/2022   ALKPHOS 66 07/27/2022   BILITOT 0.6 07/27/2022   No results found for: "25OHVITD2", "25OHVITD3", "VD25OH"   Review of Systems  Constitutional:  Negative for chills, fatigue, fever and unexpected weight change.  HENT:  Positive for congestion, postnasal drip (and morning phlegm) and trouble swallowing. Negative for hearing loss, tinnitus and voice change.   Eyes:  Negative for visual disturbance.  Respiratory:  Positive for shortness of breath. Negative for  cough, chest tightness and wheezing.   Cardiovascular:  Positive for palpitations. Negative for chest pain.  Gastrointestinal:  Positive for dysphagia (about once a month has to vomit up food that she just ate). Negative for abdominal pain, constipation, diarrhea, heartburn and  vomiting.  Endocrine: Negative for polydipsia and polyuria.  Genitourinary:  Negative for dysuria, frequency, genital sores, vaginal bleeding and vaginal discharge.  Musculoskeletal:  Negative for arthralgias, gait problem and joint swelling.  Skin:  Negative for color change and rash.  Neurological:  Negative for dizziness, tremors, light-headedness and headaches.  Hematological:  Negative for adenopathy. Does not bruise/bleed easily.  Psychiatric/Behavioral:  Negative for dysphoric mood and sleep disturbance. The patient is not nervous/anxious.     Patient Active Problem List   Diagnosis Date Noted   Essential hypertension 10/08/2022   Tachy-brady syndrome (HCC) 12/04/2021   GERD without esophagitis 12/04/2021   Generalized anxiety disorder 12/04/2021   Degenerative disc disease, lumbar 12/04/2021   OAB (overactive bladder) 12/04/2021   Acquired hypothyroidism 12/04/2021   Mixed hyperlipidemia 08/08/2021   Hx of deep venous thrombosis 08/08/2021   Osteoporosis 08/08/2021   PVD (peripheral vascular disease) (HCC) 08/08/2021   Hx of heart artery stent 08/08/2021   Hiatal hernia 08/08/2021   Asthma 11/14/2020   Atherosclerotic heart disease of native coronary artery without angina pectoris 01/10/2016   PVCs (premature ventricular contractions) 01/10/2016    Allergies  Allergen Reactions   Penicillins Hives   Other Other (See Comments)   Sulfa Antibiotics Nausea Only, Other (See Comments) and Diarrhea    HEADACHE     Past Surgical History:  Procedure Laterality Date   ABDOMINAL HYSTERECTOMY     ANTERIOR FUSION CERVICAL SPINE     twice   CHOLECYSTECTOMY     CORONARY ANGIOPLASTY WITH STENT PLACEMENT     proximal LAD and mid RCA   LUMBAR DISC SURGERY     Pace maker  11/06/2016   Medtronic [employer] Scietific Pacemaker Model L311/ Serial [id number]   THYROIDECTOMY Left     Social History   Tobacco Use   Smoking status: Never   Smokeless tobacco: Never  Vaping Use   Vaping  status: Never Used  Substance Use Topics   Alcohol use: No   Drug use: Never     Medication list has been reviewed and updated.  Current Meds  Medication Sig   alendronate (FOSAMAX) 70 MG tablet Take 1 tablet (70 mg total) by mouth once a week.   Aspirin 81 MG CAPS Take 1 tablet by mouth daily.   Calcium 200 MG TABS Take 600 mg by mouth 2 (two) times daily.   Cholecalciferol (VITAMIN D3) 250 MCG (10000 UT) CAPS Take by mouth.   clopidogrel (PLAVIX) 75 MG tablet Take 1 tablet (75 mg total) by mouth daily.   levothyroxine (SYNTHROID) 50 MCG tablet TAKE 1 TABLET BY MOUTH ONCE DAILY BEFORE  BREAKFAST   LORazepam (ATIVAN) 0.5 MG tablet Take 1 tablet (0.5 mg total) by mouth 2 (two) times daily as needed. for anxiety   mirtazapine (REMERON) 30 MG tablet Take 1 tablet (30 mg total) by mouth daily.   NIFEdipine (PROCARDIA-XL/NIFEDICAL-XL) 30 MG 24 hr tablet Take 1 tablet (30 mg) by mouth once daily   nitroGLYCERIN (NITROSTAT) 0.4 MG SL tablet Place under the tongue.   oxybutynin (DITROPAN-XL) 10 MG 24 hr tablet Take 1 tablet (10 mg total) by mouth daily.   pantoprazole (PROTONIX) 40 MG tablet Take 1 tablet (40 mg total) by mouth 2 (  two) times daily.   pravastatin (PRAVACHOL) 40 MG tablet Take 1 tablet (40 mg total) by mouth daily.   sacubitril-valsartan (ENTRESTO) 24-26 MG Take 1 tablet by mouth 2 (two) times daily.   SOTALOL AF 80 MG TABS Take 1 tablet (80 mg total) by mouth 2 (two) times daily.       10/08/2022   10:02 AM 03/26/2022   10:28 AM 12/04/2021    4:59 PM  GAD 7 : Generalized Anxiety Score  Nervous, Anxious, on Edge 0 1 0  Control/stop worrying 0 0 0  Worry too much - different things 0 0 0  Trouble relaxing 0 0 0  Restless 0 0 0  Easily annoyed or irritable 0 0 0  Afraid - awful might happen 0 0 0  Total GAD 7 Score 0 1 0  Anxiety Difficulty Not difficult at all Not difficult at all Not difficult at all       10/08/2022   10:01 AM 03/26/2022   10:28 AM 01/13/2022    10:15 AM  Depression screen PHQ 2/9  Decreased Interest 0 0 0  Down, Depressed, Hopeless 0 0 0  PHQ - 2 Score 0 0 0  Altered sleeping 0 0 0  Tired, decreased energy 0 0 3  Change in appetite 2 0 0  Feeling bad or failure about yourself  0 0 0  Trouble concentrating 0 0 0  Moving slowly or fidgety/restless 0 0 0  Suicidal thoughts 0 0 0  PHQ-9 Score 2 0 3  Difficult doing work/chores Not difficult at all Not difficult at all Not difficult at all    BP Readings from Last 3 Encounters:  10/08/22 122/79  10/07/22 (!) 149/85  07/27/22 116/80    Physical Exam Vitals and nursing note reviewed.  Constitutional:      General: She is not in acute distress.    Appearance: Normal appearance. She is well-developed. She is obese.  HENT:     Head: Normocephalic and atraumatic.     Right Ear: Tympanic membrane and ear canal normal.     Left Ear: Tympanic membrane and ear canal normal.     Nose:     Right Sinus: No maxillary sinus tenderness or frontal sinus tenderness.     Left Sinus: No maxillary sinus tenderness or frontal sinus tenderness.  Eyes:     General: No scleral icterus.       Right eye: No discharge.        Left eye: No discharge.     Conjunctiva/sclera: Conjunctivae normal.  Neck:     Thyroid: No thyromegaly.     Vascular: No carotid bruit.  Cardiovascular:     Rate and Rhythm: Normal rate and regular rhythm.     Pulses: Normal pulses.     Heart sounds: Normal heart sounds.  Pulmonary:     Effort: Pulmonary effort is normal. No respiratory distress.     Breath sounds: No wheezing.  Chest:  Breasts:    Right: No mass, nipple discharge, skin change or tenderness.     Left: No mass, nipple discharge, skin change or tenderness.  Abdominal:     General: Bowel sounds are normal.     Palpations: Abdomen is soft.     Tenderness: There is no abdominal tenderness.  Musculoskeletal:     Cervical back: Normal range of motion. No erythema.     Right lower leg: No edema.      Left lower leg: No edema.  Lymphadenopathy:  Cervical: No cervical adenopathy.  Skin:    General: Skin is warm and dry.     Findings: No rash.  Neurological:     Mental Status: She is alert and oriented to person, place, and time.     Cranial Nerves: No cranial nerve deficit.     Sensory: No sensory deficit.     Deep Tendon Reflexes: Reflexes are normal and symmetric.  Psychiatric:        Attention and Perception: Attention normal.        Mood and Affect: Mood normal.        Behavior: Behavior normal.     Wt Readings from Last 3 Encounters:  10/08/22 176 lb (79.8 kg)  10/07/22 177 lb 12.8 oz (80.6 kg)  07/27/22 174 lb (78.9 kg)    BP 122/79 (BP Location: Left Arm, Cuff Size: Large)   Pulse 70   Ht 5' (1.524 m)   Wt 176 lb (79.8 kg)   SpO2 97%   BMI 34.37 kg/m   Assessment and Plan:  Problem List Items Addressed This Visit       Unprioritized   Mixed hyperlipidemia (Chronic)    LDL is  Lab Results  Component Value Date   LDLCALC 83 03/26/2022   Currently being treated with pravachol with good compliance and no concerns.       Relevant Orders   Lipid panel   GERD without esophagitis (Chronic)    Reflux controlled on PPI but having some dysphagia and emesis after eating recently. Continue PPI Will order Barium swallow      Generalized anxiety disorder (Chronic)    Clinically stable on current regimen with good control of symptoms, No SI or HI. No change in management at this time.       Essential hypertension    BP has been running slightly high despite Entresto, Solotol and Nifedipine Seeing cardiology - ECHO planned      Relevant Orders   Comprehensive metabolic panel   Acquired hypothyroidism (Chronic)    Supplemented       Relevant Orders   TSH + free T4   Other Visit Diagnoses     Annual physical exam    -  Primary   Encounter for screening mammogram for breast cancer       Relevant Orders   MM 3D SCREENING MAMMOGRAM BILATERAL  BREAST   Need for vaccination for pneumococcus       Relevant Orders   Pneumococcal conjugate vaccine 20-valent (Completed)   Other dysphagia       Relevant Orders   DG ESOPHAGUS W SINGLE CM (SOL OR THIN BA)       Return in about 6 months (around 04/10/2023) for HTN, anxiety.    Reubin Milan, MD [facility] [place]

## 2022-10-08 NOTE — Patient Instructions (Signed)
Use Flonase nasal spray daily for congestion.  You can continue Mucinex which will help thin the phlegm but is not an allergy medication.  Someone will call you to schedule the barium swallow.

## 2022-10-08 NOTE — Assessment & Plan Note (Signed)
Supplemented 

## 2022-10-09 LAB — LIPID PANEL
Chol/HDL Ratio: 3.3 ratio (ref 0.0–4.4)
Cholesterol, Total: 173 mg/dL (ref 100–199)
HDL: 52 mg/dL (ref >39)
LDL Chol Calc (NIH): 85 mg/dL (ref 0–99)
Triglycerides: 219 mg/dL — ABNORMAL HIGH (ref 0–149)
VLDL Cholesterol Cal: 36 mg/dL (ref 5–40)

## 2022-10-09 LAB — COMPREHENSIVE METABOLIC PANEL
ALT: 12 IU/L (ref 0–32)
AST: 17 IU/L (ref 0–40)
Albumin: 4.2 g/dL (ref 3.7–4.7)
Alkaline Phosphatase: 85 IU/L (ref 44–121)
BUN/Creatinine Ratio: 14 (ref 12–28)
BUN: 13 mg/dL (ref 8–27)
Bilirubin Total: 0.4 mg/dL (ref 0.0–1.2)
CO2: 22 mmol/L (ref 20–29)
Calcium: 9.2 mg/dL (ref 8.7–10.3)
Chloride: 104 mmol/L (ref 96–106)
Creatinine, Ser: 0.9 mg/dL (ref 0.57–1.00)
Globulin, Total: 2.4 g/dL (ref 1.5–4.5)
Glucose: 94 mg/dL (ref 70–99)
Potassium: 5.2 mmol/L (ref 3.5–5.2)
Sodium: 139 mmol/L (ref 134–144)
Total Protein: 6.6 g/dL (ref 6.0–8.5)
eGFR: 62 mL/min/{1.73_m2} (ref >59)

## 2022-10-09 LAB — TSH+FREE T4
Free T4: 1.57 ng/dL (ref 0.82–1.77)
TSH: 1.37 u[IU]/mL (ref 0.450–4.500)

## 2022-10-14 ENCOUNTER — Ambulatory Visit
Admission: RE | Admit: 2022-10-14 | Discharge: 2022-10-14 | Disposition: A | Discharge status: Home / Self Care | Payer: Medicare HMO | Source: Ambulatory Visit | Attending: Internal Medicine | Admitting: Internal Medicine

## 2022-10-14 DIAGNOSIS — R1319 Other dysphagia: Secondary | ICD-10-CM | POA: Insufficient documentation

## 2022-10-14 DIAGNOSIS — R1310 Dysphagia, unspecified: Secondary | ICD-10-CM | POA: Diagnosis not present

## 2022-10-14 DIAGNOSIS — K219 Gastro-esophageal reflux disease without esophagitis: Secondary | ICD-10-CM | POA: Diagnosis not present

## 2022-10-27 ENCOUNTER — Other Ambulatory Visit: Payer: Self-pay | Admitting: Internal Medicine

## 2022-10-27 DIAGNOSIS — F411 Generalized anxiety disorder: Secondary | ICD-10-CM

## 2022-10-27 DIAGNOSIS — M810 Age-related osteoporosis without current pathological fracture: Secondary | ICD-10-CM

## 2022-10-31 ENCOUNTER — Ambulatory Visit: Payer: Medicare HMO | Attending: Cardiology

## 2022-10-31 DIAGNOSIS — R600 Localized edema: Secondary | ICD-10-CM

## 2022-10-31 DIAGNOSIS — R609 Edema, unspecified: Secondary | ICD-10-CM

## 2022-10-31 LAB — ECHOCARDIOGRAM COMPLETE
AR max vel: 2.13 cm2
AV Area VTI: 1.99 cm2
AV Area mean vel: 1.99 cm2
AV Mean grad: 3 mmHg
AV Peak grad: 4.5 mmHg
Ao pk vel: 1.06 m/s
Area-P 1/2: 3.37 cm2
Calc EF: 56.7 %
S' Lateral: 2.9 cm
Single Plane A2C EF: 54 %
Single Plane A4C EF: 58.1 %

## 2022-11-01 ENCOUNTER — Other Ambulatory Visit: Payer: Self-pay | Admitting: Internal Medicine

## 2022-11-22 ENCOUNTER — Other Ambulatory Visit: Payer: Self-pay | Admitting: Cardiovascular Disease

## 2022-11-22 DIAGNOSIS — I495 Sick sinus syndrome: Secondary | ICD-10-CM

## 2022-11-27 ENCOUNTER — Other Ambulatory Visit: Payer: Self-pay | Admitting: Internal Medicine

## 2022-11-27 DIAGNOSIS — F411 Generalized anxiety disorder: Secondary | ICD-10-CM

## 2022-11-27 DIAGNOSIS — M5136 Other intervertebral disc degeneration, lumbar region with discogenic back pain only: Secondary | ICD-10-CM

## 2022-11-28 DIAGNOSIS — M5136 Other intervertebral disc degeneration, lumbar region with discogenic back pain only: Secondary | ICD-10-CM | POA: Diagnosis not present

## 2022-11-28 DIAGNOSIS — M51369 Other intervertebral disc degeneration, lumbar region without mention of lumbar back pain or lower extremity pain: Secondary | ICD-10-CM | POA: Diagnosis not present

## 2022-11-28 NOTE — Telephone Encounter (Signed)
Please review.  KP

## 2022-11-29 ENCOUNTER — Other Ambulatory Visit: Payer: Self-pay | Admitting: Internal Medicine

## 2022-11-29 DIAGNOSIS — F411 Generalized anxiety disorder: Secondary | ICD-10-CM

## 2022-12-01 ENCOUNTER — Other Ambulatory Visit: Payer: Self-pay | Admitting: Internal Medicine

## 2022-12-01 DIAGNOSIS — F411 Generalized anxiety disorder: Secondary | ICD-10-CM

## 2022-12-01 MED ORDER — LORAZEPAM 0.5 MG PO TABS: 0.5000 mg | ORAL_TABLET | Freq: Two times a day (BID) | ORAL | 5 refills | Status: DC | PRN

## 2022-12-01 NOTE — Telephone Encounter (Signed)
Please review.  KP

## 2022-12-10 DIAGNOSIS — M5416 Radiculopathy, lumbar region: Secondary | ICD-10-CM | POA: Diagnosis not present

## 2022-12-10 DIAGNOSIS — M545 Low back pain, unspecified: Secondary | ICD-10-CM | POA: Diagnosis not present

## 2022-12-12 ENCOUNTER — Other Ambulatory Visit: Payer: Self-pay | Admitting: Neurosurgery

## 2022-12-12 DIAGNOSIS — M5416 Radiculopathy, lumbar region: Secondary | ICD-10-CM

## 2022-12-18 ENCOUNTER — Ambulatory Visit
Admission: RE | Admit: 2022-12-18 | Discharge: 2022-12-18 | Disposition: A | Discharge status: Home / Self Care | Payer: Medicare HMO | Source: Ambulatory Visit | Attending: Neurosurgery | Admitting: Neurosurgery

## 2022-12-18 DIAGNOSIS — M545 Low back pain, unspecified: Secondary | ICD-10-CM | POA: Diagnosis not present

## 2022-12-18 DIAGNOSIS — M5416 Radiculopathy, lumbar region: Secondary | ICD-10-CM | POA: Diagnosis not present

## 2022-12-18 DIAGNOSIS — D1809 Hemangioma of other sites: Secondary | ICD-10-CM | POA: Diagnosis not present

## 2022-12-18 DIAGNOSIS — R29890 Loss of height: Secondary | ICD-10-CM | POA: Diagnosis not present

## 2022-12-18 DIAGNOSIS — M5116 Intervertebral disc disorders with radiculopathy, lumbar region: Secondary | ICD-10-CM | POA: Diagnosis not present

## 2022-12-23 DIAGNOSIS — E785 Hyperlipidemia, unspecified: Secondary | ICD-10-CM | POA: Diagnosis not present

## 2022-12-23 DIAGNOSIS — J9811 Atelectasis: Secondary | ICD-10-CM | POA: Diagnosis not present

## 2022-12-23 DIAGNOSIS — K449 Diaphragmatic hernia without obstruction or gangrene: Secondary | ICD-10-CM | POA: Diagnosis not present

## 2022-12-23 DIAGNOSIS — R072 Precordial pain: Secondary | ICD-10-CM | POA: Diagnosis not present

## 2022-12-23 DIAGNOSIS — I517 Cardiomegaly: Secondary | ICD-10-CM | POA: Diagnosis not present

## 2022-12-23 DIAGNOSIS — Z882 Allergy status to sulfonamides status: Secondary | ICD-10-CM | POA: Diagnosis not present

## 2022-12-23 DIAGNOSIS — G8929 Other chronic pain: Secondary | ICD-10-CM | POA: Diagnosis not present

## 2022-12-23 DIAGNOSIS — R110 Nausea: Secondary | ICD-10-CM | POA: Diagnosis not present

## 2022-12-23 DIAGNOSIS — J45909 Unspecified asthma, uncomplicated: Secondary | ICD-10-CM | POA: Diagnosis not present

## 2022-12-23 DIAGNOSIS — M549 Dorsalgia, unspecified: Secondary | ICD-10-CM | POA: Diagnosis not present

## 2022-12-23 DIAGNOSIS — R079 Chest pain, unspecified: Secondary | ICD-10-CM | POA: Diagnosis not present

## 2022-12-23 DIAGNOSIS — Z950 Presence of cardiac pacemaker: Secondary | ICD-10-CM | POA: Diagnosis not present

## 2022-12-23 DIAGNOSIS — Z880 Allergy status to penicillin: Secondary | ICD-10-CM | POA: Diagnosis not present

## 2022-12-24 ENCOUNTER — Other Ambulatory Visit: Payer: Self-pay | Admitting: Internal Medicine

## 2022-12-24 DIAGNOSIS — I25110 Atherosclerotic heart disease of native coronary artery with unstable angina pectoris: Secondary | ICD-10-CM | POA: Diagnosis not present

## 2022-12-24 DIAGNOSIS — I10 Essential (primary) hypertension: Secondary | ICD-10-CM | POA: Diagnosis not present

## 2022-12-24 DIAGNOSIS — E782 Mixed hyperlipidemia: Secondary | ICD-10-CM | POA: Diagnosis not present

## 2022-12-24 MED ORDER — LEVOTHYROXINE SODIUM 50 MCG PO TABS: 50.0000 ug | ORAL_TABLET | Freq: Every day | ORAL | 0 refills | Status: DC

## 2022-12-25 DIAGNOSIS — R079 Chest pain, unspecified: Secondary | ICD-10-CM | POA: Diagnosis not present

## 2023-01-01 NOTE — Progress Notes (Addendum)
Office Visit    Patient Name: Sandra Murphy Date of Encounter: 01/02/2023  Primary Care Provider:  Reubin Milan, MD Primary Cardiologist:  None  Chief Complaint    89 y.o. female past medical history of coronary artery disease s/p stents to proximal LAD and mid RCA 2013, tachybrady s/p PPM, PVCs and PACs, hypertension, hyperlipidemia, DVT, anxiety.   Past Medical History  Subjective   Past Medical History:  Diagnosis Date   CAD (coronary artery disease)    stents to proximal LAD and mid RCA   Hyperlipidemia    Hypertension    Hypothyroidism    IBS (irritable bowel syndrome)    Osteoarthritis    PVD (peripheral vascular disease) (HCC)    SVT (supraventricular tachycardia) (HCC)    Past Surgical History:  Procedure Laterality Date   ABDOMINAL HYSTERECTOMY     ANTERIOR FUSION CERVICAL SPINE     twice   CHOLECYSTECTOMY     CORONARY ANGIOPLASTY WITH STENT PLACEMENT     proximal LAD and mid RCA   LUMBAR DISC SURGERY     Pace maker  11/06/2016   Medtronic [employer] Scietific Pacemaker Model L311/ Serial [id number]   THYROIDECTOMY Left     Allergies  Allergies  Allergen Reactions   Penicillins Hives   Other Other (See Comments)   Sulfa Antibiotics Nausea Only, Other (See Comments) and Diarrhea    HEADACHE       History of Present Illness      89 y.o. y/o female past medical history of coronary artery disease s/p stents to proximal LAD and mid RCA 2013, tachybrady s/p PPM, PVCs and PACs, hypertension, hyperlipidemia, DVT, anxiety.   She had 7-day monitor 12/17/2015 that showed frequent atrial and ventricular ectopy, SVT and NSVT, with an average HR of 69, low 50, high 179, 15% PACs, 2.4% PVCs.  30-day event Monitor 07/28/2016 showed NSR with PAF, HR 40-119 bpm, Average 60bpm. DDDR permanent pacemaker [employer] implanted on 11/07/2016.    History of stents to proximal LAD and mid RCA in 2013.  Echocardiogram 10/2014 showing EF 60 to 65%, no significant valvular  disease. LHC 03/27/2015 showing mild to moderate CAD with patent stents, EF 60%.  Echocardiogram 09/2015 EF 70-75%, no significant valvular disease.  Stress test 09/2015, negative for ischemia.  LHC 01/09/2016 reported no change in vessels from prior cath in 2017.  LHC 06/2016 showed mild CAD, patent stents to proximal LAD and mid large dominant RCA.  Most recent echocardiogram 10/31/2022 showing LVEF 55-60%, no RWMA, mild LVH, LV normal function, grade 2 diastolic dysfunction, RV SF normal, left atrial size mildly dilated, mild mitral valve regurgitation.  Was seen at [facility] ED on 12/23/22 for evaluation of chest pain.  She noted sharp midsternal chest pain that radiated to her back with associated nausea. Her ACS workup in the ED was negative with ECG revealing atrial paced rhythm with T wave inversion in the anterior lateral leads.  High-sensitivity troponins negative at  >3, chest x-ray unremarkable. CTA revealed no evidence of acute aortic syndrome and enlarged left atrium and central pulmonary arteries.  ED POCUS revealed no sonographic evidence of significant cardiac dysfunction and no sonographic evidence of pericardial effusion and normal RV function. Seen by [facility] cardiology for follow-up chest pain on 12/24/2022.  At the time the patient's chest pain is completely resolved.    Today patient is doing well from a cardiac standpoint.  She tells me on 10/29 she was at rest, sitting on the  couch when she developed dull/achy substernal chest pain that radiated to her back.  She notes this pain felt nothing like her prior MI in 2013 that resulted in stents.  Pain was ongoing for several hours and did not resolve until she received morphine in the ED. Since she was discharged from the ED, she has been completely chest pain-free. She denies any angina on exertion, DOE, palpitations, tachycardia, syncope.  Objective  Home Medications    Current Outpatient Medications  Medication Sig Dispense Refill    alendronate (FOSAMAX) 70 MG tablet Take 1 tablet by mouth once a week 12 tablet 3   Aspirin 81 MG CAPS Take 1 tablet by mouth daily.     Calcium 200 MG TABS Take 600 mg by mouth 2 (two) times daily.     Cholecalciferol (VITAMIN D3) 250 MCG (10000 UT) CAPS Take by mouth.     clopidogrel (PLAVIX) 75 MG tablet Take 1 tablet (75 mg total) by mouth daily. 90 tablet 3   levothyroxine (SYNTHROID) 50 MCG tablet Take 1 tablet (50 mcg total) by mouth daily before breakfast. 90 tablet 0   LORazepam (ATIVAN) 0.5 MG tablet Take 1 tablet (0.5 mg total) by mouth 2 (two) times daily as needed. for anxiety 60 tablet 5   mirtazapine (REMERON) 30 MG tablet Take 1 tablet (30 mg total) by mouth daily. 90 tablet 1   NIFEdipine (PROCARDIA-XL/NIFEDICAL-XL) 30 MG 24 hr tablet Take 1 tablet (30 mg) by mouth once daily 90 tablet 3   nitroGLYCERIN (NITROSTAT) 0.4 MG SL tablet Place under the tongue.     oxybutynin (DITROPAN-XL) 10 MG 24 hr tablet Take 1 tablet by mouth once daily 90 tablet 3   pantoprazole (PROTONIX) 40 MG tablet Take 1 tablet (40 mg total) by mouth 2 (two) times daily. 180 tablet 1   pravastatin (PRAVACHOL) 40 MG tablet Take 1 tablet (40 mg total) by mouth daily. 90 tablet 3   sacubitril-valsartan (ENTRESTO) 24-26 MG Take 1 tablet by mouth 2 (two) times daily. 180 tablet 3   SOTALOL AF 80 MG TABS Take 1 tablet by mouth twice daily 180 tablet 0   No current facility-administered medications for this visit.     Physical Exam    VS:  BP 120/74 (BP Location: Left Arm, Patient Position: Sitting, Cuff Size: Normal)   Pulse 70   Ht 5' (1.524 m)   Wt 175 lb 3.2 oz (79.5 kg)   SpO2 96%   BMI 34.22 kg/m  , BMI Body mass index is 34.22 kg/m.       GEN: Well nourished, well developed, in no acute distress. HEENT: normal. Neck: Supple, no JVD, carotid bruits, or masses. Cardiac: RRR, no murmurs, rubs, or gallops. No clubbing, cyanosis, edema.  Radials 2+/PT 2+ and equal bilaterally.  Respiratory:   Respirations regular and unlabored, clear to auscultation bilaterally. GI: Soft, nontender, nondistended, BS + x 4. MS: no deformity or atrophy. Skin: warm and dry, no rash. Neuro:  Strength and sensation are intact. Psych: Normal affect.  Accessory Clinical Findings    ECG personally reviewed by me today - EKG Interpretation Date/Time:  Friday January 02 2023 10:49:05 EST Ventricular Rate:  70 PR Interval:  268 QRS Duration:  76 QT Interval:  450 QTC Calculation: 486 R Axis:   8  Text Interpretation: Atrial-paced rhythm with prolonged AV conduction Confirmed by Rise Paganini [phone]) on 01/02/2023 12:12:18 PM  - no acute changes.  Lab Results  Component Value Date  WBC 6.4 07/27/2022   HGB 14.2 07/27/2022   HCT 41.5 07/27/2022   MCV 89.4 07/27/2022   PLT 248 07/27/2022   Lab Results  Component Value Date   CREATININE 0.90 10/08/2022   BUN 13 10/08/2022   NA 139 10/08/2022   K 5.2 10/08/2022   CL 104 10/08/2022   CO2 22 10/08/2022   Lab Results  Component Value Date   ALT 12 10/08/2022   AST 17 10/08/2022   ALKPHOS 85 10/08/2022   BILITOT 0.4 10/08/2022   Lab Results  Component Value Date   CHOL 173 10/08/2022   HDL 52 10/08/2022   LDLCALC 85 10/08/2022   TRIG 219 (H) 10/08/2022   CHOLHDL 3.3 10/08/2022    No results found for: "HGBA1C" Lab Results  Component Value Date   TSH 1.370 10/08/2022       Assessment & Plan    1.  Coronary artery disease / chest pain -PCI/DES proximal LAD and mid RCA 2013, since has had multiple caths with stable disease and patent stents -LHC 06/2016 showed mild CAD, patent stents to proximal LAD and mid RCA -ED visit for chest pain on 12/23/2022 lasting several hours with EKG showing T wave inversions in the anterior lateral leads that are similar to her previous, normal troponins and CTA with no evidence of acute aortic syndrome -Currently CP free and without anginal symptoms since ED visit -With symptoms concerning for  ischemia we reviewed her options for Lexiscan MPI, PET stress, or cardiac cath -After long discussion with patient it was decided to defer testing as she has been CP free. She will call office if she develops angina and ooutpatient study can be ordered then  -Continue plavix 75mg , ASA 81mg , pravastatin 40mg   2.  Tachybradycardia syndrome / Dual chamber PPM in situ / PAC,PVC -Continue sotaolol BID  -Follows with EP  3. Hypertension  -BP today 120/74 -Controlled, continue antihypertensive regimen   4. Hyperlipidemia -LDL 85 on 10/08/22 -Continue pravastatin 40mg   -Heart healthy diet  Disposition: Follow-up in 6 weeks  Denyce Robert, NP 01/02/2023, 4:45 PM

## 2023-01-02 ENCOUNTER — Ambulatory Visit: Payer: Medicare HMO | Attending: Nurse Practitioner | Admitting: Emergency Medicine

## 2023-01-02 ENCOUNTER — Encounter: Payer: Self-pay | Admitting: Nurse Practitioner

## 2023-01-02 VITALS — BP 120/74 | HR 70 | Ht 60.0 in | Wt 175.2 lb

## 2023-01-02 DIAGNOSIS — E785 Hyperlipidemia, unspecified: Secondary | ICD-10-CM

## 2023-01-02 DIAGNOSIS — R079 Chest pain, unspecified: Secondary | ICD-10-CM | POA: Diagnosis not present

## 2023-01-02 DIAGNOSIS — I10 Essential (primary) hypertension: Secondary | ICD-10-CM | POA: Diagnosis not present

## 2023-01-02 DIAGNOSIS — I495 Sick sinus syndrome: Secondary | ICD-10-CM

## 2023-01-02 DIAGNOSIS — I2510 Atherosclerotic heart disease of native coronary artery without angina pectoris: Secondary | ICD-10-CM

## 2023-01-02 NOTE — Patient Instructions (Signed)
Medication Instructions:  No changes *If you need a refill on your cardiac medications before your next appointment, please call your pharmacy*   Lab Work: None ordered If you have labs (blood work) drawn today and your tests are completely normal, you will receive your results only by: MyChart Message (if you have MyChart) OR A paper copy in the mail If you have any lab test that is abnormal or we need to change your treatment, we will call you to review the results.   Testing/Procedures: None ordered   Follow-Up: At [facility], you and your health needs are our priority.  As part of our continuing mission to provide you with exceptional heart care, we have created designated Provider Care Teams.  These Care Teams include your primary Cardiologist (physician) and Advanced Practice Providers (APPs -  Physician Assistants and Nurse Practitioners) who all work together to provide you with the care you need, when you need it.  We recommend signing up for the patient portal called "MyChart".  Sign up information is provided on this After Visit Summary.  MyChart is used to connect with patients for Virtual Visits (Telemedicine).  Patients are able to view lab/test results, encounter notes, upcoming appointments, etc.  Non-urgent messages can be sent to your provider as well.   To learn more about what you can do with MyChart, go to [email or web address].    Your next appointment:   6 week(s)  Provider:    Nicolasa Ducking, NP

## 2023-01-08 ENCOUNTER — Ambulatory Visit: Payer: Medicare HMO | Admitting: Cardiology

## 2023-01-12 ENCOUNTER — Ambulatory Visit
Admission: RE | Admit: 2023-01-12 | Discharge: 2023-01-12 | Disposition: A | Discharge status: Home / Self Care | Payer: Medicare HMO | Source: Ambulatory Visit | Attending: Internal Medicine | Admitting: Internal Medicine

## 2023-01-12 DIAGNOSIS — Z1231 Encounter for screening mammogram for malignant neoplasm of breast: Secondary | ICD-10-CM | POA: Insufficient documentation

## 2023-01-13 NOTE — Progress Notes (Unsigned)
Cardiology Office Note Date:  01/14/2023  Patient ID:  Sandra Murphy, Sandra Murphy [birth year], MRN [id number] PCP:  Reubin Milan, MD  Cardiologist:  None Electrophysiologist: Lanier Prude, MD   Chief Complaint: routine sotalol follow-up  History of Present Illness: Sandra Murphy is a 89 y.o. female with PMH notable for CAD s/p PCI, tachy-brady s/p PPM, PVCs, PACs, h/o DVT (2022), SCAF, anxiety; seen today for Lanier Prude, MD for routine electrophysiology followup.  She last saw Dr. Lalla Brothers 04/2022 to estabish PPM care with our office after moving to the area from [place] to be near family. She is on sotalol for palpitations, PACs, PVCs. Had brief SCAF episodes by device, not on OAC. No plans to start [facility] unless significantly higher burden. I saw her in follow-up 09/2022 where she was doing well, did have increased lower extremity edema. Updated echo showed normal LVEF with g2dd. She had an episode of chest pain at rest 11/2022, was eval'd at [facility]. Trops flat. She was seen by NP [facility] in follow-up. Deferred additional ischemic eval given she had remained CP free.  On follow-up today, other than her single chest pain episode, she is doing very well. Denies palpitations, increased edema, syncope.     Device Information: Bos Sci dual chamber PPM, imp 10/2016  AAD History: Sotalol   Past Medical History:  Diagnosis Date   CAD (coronary artery disease)    stents to proximal LAD and mid RCA   Hyperlipidemia    Hypertension    Hypothyroidism    IBS (irritable bowel syndrome)    Osteoarthritis    PVD (peripheral vascular disease) (HCC)    SVT (supraventricular tachycardia) (HCC)     Past Surgical History:  Procedure Laterality Date   ABDOMINAL HYSTERECTOMY     ANTERIOR FUSION CERVICAL SPINE     twice   CHOLECYSTECTOMY     CORONARY ANGIOPLASTY WITH STENT PLACEMENT     proximal LAD and mid RCA   LUMBAR DISC SURGERY     Pace maker  11/06/2016   Medtronic Boston  Scietific Pacemaker Model L311/ Serial [id number]   THYROIDECTOMY Left     Current Outpatient Medications  Medication Instructions   alendronate (FOSAMAX) 70 mg, Oral, Weekly   Aspirin 81 MG CAPS 1 tablet, Oral, Daily   Calcium 600 mg, Oral, 2 times daily   Cholecalciferol (VITAMIN D3) 250 MCG (10000 UT) CAPS Oral   clopidogrel (PLAVIX) 75 mg, Oral, Daily   levothyroxine (SYNTHROID) 50 mcg, Oral, Daily before breakfast   LORazepam (ATIVAN) 0.5 mg, Oral, 2 times daily PRN, for anxiety   mirtazapine (REMERON) 30 mg, Oral, Daily   NIFEdipine (PROCARDIA-XL/NIFEDICAL-XL) 30 MG 24 hr tablet Take 1 tablet (30 mg) by mouth once daily   nitroGLYCERIN (NITROSTAT) 0.4 MG SL tablet Sublingual   oxybutynin (DITROPAN-XL) 10 mg, Oral, Daily   pantoprazole (PROTONIX) 40 mg, Oral, 2 times daily   pravastatin (PRAVACHOL) 40 mg, Oral, Daily   sacubitril-valsartan (ENTRESTO) 24-26 MG 1 tablet, Oral, 2 times daily   SOTALOL AF 80 mg, Oral, 2 times daily   traMADol (ULTRAM) 50 mg, Oral, Every 6 hours PRN    Social History:  The patient  reports that she has never smoked. She has never used smokeless tobacco. She reports that she does not drink alcohol and does not use drugs.   Family History:  The patient's family history includes Cancer in her mother; Diabetes in her sister; Hearing loss in her father; Heart attack in  her father; Heart disease in her brother and sister; Hypertension in her sister; Liver cancer in her sister; Lung disease in her brother; Pancreatic cancer in her sister.  ROS:  Please see the history of present illness. All other systems are reviewed and otherwise negative.   PHYSICAL EXAM:  VS:  BP 118/70 (BP Location: Left Arm, Patient Position: Sitting, Cuff Size: Large)   Pulse 73   Ht 5' (1.524 m)   Wt 175 lb 2 oz (79.4 kg)   SpO2 96%   BMI 34.20 kg/m  BMI: Body mass index is 34.2 kg/m.  GEN- The patient is well appearing, alert and oriented x 3 today.   Lungs- Clear to  ausculation bilaterally, normal work of breathing.  Heart- Regular rate and rhythm, no murmurs, rubs or gallops Extremities- 2-3+ peripheral edema, warm, dry Skin-   device pocket well-healed, no tethering   Device interrogation done today and reviewed by myself:  Battery 2.5 years Lead thresholds, impedence, sensing stable  Freq aflutter-appearing episodes, but with variable p-p intervals. Query whether retrograde conduction of with Extensive adjustments made including - reducing ATR trigger rate, reducing max-tract rate, extending AV delay < 1% AFib burden   EKG is ordered. Personal review of EKG from today shows:   EKG Interpretation Date/Time:  Wednesday January 14 2023 13:27:55 EST Ventricular Rate:  73 PR Interval:  248 QRS Duration:  94 QT Interval:  426 QTC Calculation: 469 R Axis:   1  Text Interpretation: Atrial-paced rhythm with prolonged AV conduction Nonspecific T wave abnormality Confirmed by Sherie Don [phone]) on 01/14/2023 1:46:49 PM    10/07/2022 -- QT - , QTc -   Recent Labs: 07/27/2022: Hemoglobin 14.2; Platelets 248 10/08/2022: ALT 12; BUN 13; Creatinine, Ser 0.90; Potassium 5.2; Sodium 139; TSH 1.370  10/08/2022: Chol/HDL Ratio 3.3; Cholesterol, Total 173; HDL 52; LDL Chol Calc (NIH) 85; Triglycerides 219   CrCl cannot be calculated (Patient's most recent lab result is older than the maximum 21 days allowed.).   Wt Readings from Last 3 Encounters:  01/14/23 175 lb 2 oz (79.4 kg)  01/02/23 175 lb 3.2 oz (79.5 kg)  10/08/22 176 lb (79.8 kg)     Additional studies reviewed include: Previous EP, cardiology notes.   CTA chest/abd, 12/23/2022 No evidence of acute aortic syndrome.  Enlarged left atrium and central pulmonary arteries.  Large hiatal hernia.  Additional chronic and incidental findings as detailed in the body of the report.   TTE, 10/31/2022  1. Left ventricular ejection fraction, by estimation, is 55 to 60%. Left  ventricular ejection fraction by 2D MOD biplane is 56.7 %. The left ventricle has normal function. The left ventricle has no regional wall motion abnormalities. There is mild left ventricular hypertrophy. Left ventricular diastolic parameters are consistent with Grade II diastolic dysfunction (pseudonormalization).   2. Right ventricular systolic function is normal. The right ventricular size is normal.   3. Left atrial size was mildly dilated.   4. The mitral valve is normal in structure. Mild mitral valve regurgitation.   5. The aortic valve is tricuspid. Aortic valve regurgitation is not visualized. Aortic valve sclerosis is present, with no evidence of aortic valve stenosis.   6. The inferior vena cava is normal in size with greater than 50% respiratory variability, suggesting right atrial pressure of 3 mmHg.    ASSESSMENT AND PLAN:  #) tachy-brady syndome #) Bos Sci dual chamber PPM in situ Multiple device adjustments, see paceart for details Bos Sci  rep present during clinic visit to assist with device adjustments Patient will have latitude transmission in 2 days to further eval  #) PACs #) PVCs EKG with stable intervals on sotalol 80mg  BID Electrolytes stable on 11/2022 labs  #) SCAF Continues to have <1% AF burden with short, brief episodes Multiple device adjustments as above to further monitor AF episodes No OAC at this time  #) HTN Well-controlled at this time        Current medicines are reviewed at length with the patient today.   The patient does not have concerns regarding her medicines.  The following changes were made today:  none  Labs/ tests ordered today include:  Orders Placed This Encounter  Procedures   EKG 12-Lead     Disposition: Follow up with Dr. Lalla Brothers or EP APP in 6 months   Signed, Sherie Don, NP  01/14/23  3:37 PM  Electrophysiology [facility]

## 2023-01-14 ENCOUNTER — Encounter: Payer: Self-pay | Admitting: Cardiology

## 2023-01-14 ENCOUNTER — Ambulatory Visit: Payer: Medicare HMO

## 2023-01-14 ENCOUNTER — Ambulatory Visit: Payer: Medicare HMO | Attending: Cardiology | Admitting: Cardiology

## 2023-01-14 VITALS — BP 118/70 | HR 73 | Ht 60.0 in | Wt 175.1 lb

## 2023-01-14 DIAGNOSIS — I480 Paroxysmal atrial fibrillation: Secondary | ICD-10-CM

## 2023-01-14 DIAGNOSIS — I493 Ventricular premature depolarization: Secondary | ICD-10-CM

## 2023-01-14 DIAGNOSIS — Z0000 Encounter for general adult medical examination without abnormal findings: Secondary | ICD-10-CM | POA: Diagnosis not present

## 2023-01-14 DIAGNOSIS — I495 Sick sinus syndrome: Secondary | ICD-10-CM | POA: Diagnosis not present

## 2023-01-14 DIAGNOSIS — Z950 Presence of cardiac pacemaker: Secondary | ICD-10-CM | POA: Diagnosis not present

## 2023-01-14 DIAGNOSIS — I10 Essential (primary) hypertension: Secondary | ICD-10-CM | POA: Diagnosis not present

## 2023-01-14 LAB — CUP PACEART INCLINIC DEVICE CHECK
Date Time Interrogation Session: 20241120155939
Implantable Lead Connection Status: 753985
Implantable Lead Connection Status: 753985
Implantable Lead Implant Date: 20180913
Implantable Lead Implant Date: 20180913
Implantable Lead Location: 753859
Implantable Lead Location: 753860
Implantable Lead Model: 4473
Implantable Lead Model: 7742
Implantable Lead Serial Number: 497240
Implantable Lead Serial Number: 841204
Implantable Pulse Generator Implant Date: 20180913
Lead Channel Impedance Value: 396 Ohm
Lead Channel Impedance Value: 927 Ohm
Lead Channel Pacing Threshold Amplitude: 0.4 V
Lead Channel Pacing Threshold Amplitude: 0.9 V
Lead Channel Pacing Threshold Pulse Width: 0.4 ms
Lead Channel Pacing Threshold Pulse Width: 0.4 ms
Lead Channel Sensing Intrinsic Amplitude: 25 mV
Lead Channel Sensing Intrinsic Amplitude: 3.1 mV
Lead Channel Setting Pacing Amplitude: 2 V
Lead Channel Setting Pacing Amplitude: 2 V
Lead Channel Setting Pacing Pulse Width: 0.4 ms
Lead Channel Setting Sensing Sensitivity: 2.5 mV
Pulse Gen Serial Number: 374129
Zone Setting Status: 755011

## 2023-01-14 NOTE — Progress Notes (Signed)
Subjective:   Sandra Murphy is a 89 y.o. female who presents for Medicare Annual (Subsequent) preventive examination.  Visit Complete: Virtual I connected with  [name] on 01/14/23 by a audio enabled telemedicine application and verified that I am speaking with the correct person using two identifiers.  Patient Location: Home  Provider Location: Office/Clinic  I discussed the limitations of evaluation and management by telemedicine. The patient expressed understanding and agreed to proceed.  Vital Signs: Because this visit was a virtual/telehealth visit, some criteria may be missing or patient reported. Any vitals not documented were not able to be obtained and vitals that have been documented are patient reported.      Objective:    Today's Vitals   01/14/23 1020  PainSc: 3    There is no height or weight on file to calculate BMI.     01/14/2023   10:25 AM 01/13/2022   10:26 AM 01/22/2016    7:14 PM  Advanced Directives  Does Patient Have a Medical Advance Directive? No Yes Yes  Type of Special educational needs teacher of [employer] Power of [place];Living will  Does patient want to make changes to medical advance directive?  No - Patient declined   Copy of Healthcare Power of Attorney in Chart?  No - copy requested No - copy requested  Would patient like information on creating a medical advance directive? No - Patient declined  No - Patient declined    Current Medications (verified) Outpatient Encounter Medications as of 01/14/2023  Medication Sig   alendronate (FOSAMAX) 70 MG tablet Take 1 tablet by mouth once a week   Aspirin 81 MG CAPS Take 1 tablet by mouth daily.   Calcium 200 MG TABS Take 600 mg by mouth 2 (two) times daily.   clopidogrel (PLAVIX) 75 MG tablet Take 1 tablet (75 mg total) by mouth daily.   levothyroxine (SYNTHROID) 50 MCG tablet Take 1 tablet (50 mcg total) by mouth daily before breakfast.   LORazepam (ATIVAN) 0.5 MG tablet  Take 1 tablet (0.5 mg total) by mouth 2 (two) times daily as needed. for anxiety   mirtazapine (REMERON) 30 MG tablet Take 1 tablet (30 mg total) by mouth daily.   NIFEdipine (PROCARDIA-XL/NIFEDICAL-XL) 30 MG 24 hr tablet Take 1 tablet (30 mg) by mouth once daily   nitroGLYCERIN (NITROSTAT) 0.4 MG SL tablet Place under the tongue.   oxybutynin (DITROPAN-XL) 10 MG 24 hr tablet Take 1 tablet by mouth once daily   pantoprazole (PROTONIX) 40 MG tablet Take 1 tablet (40 mg total) by mouth 2 (two) times daily.   pravastatin (PRAVACHOL) 40 MG tablet Take 1 tablet (40 mg total) by mouth daily.   sacubitril-valsartan (ENTRESTO) 24-26 MG Take 1 tablet by mouth 2 (two) times daily.   SOTALOL AF 80 MG TABS Take 1 tablet by mouth twice daily   Cholecalciferol (VITAMIN D3) 250 MCG (10000 UT) CAPS Take by mouth. (Patient not taking: Reported on 01/14/2023)   No facility-administered encounter medications on file as of 01/14/2023.    Allergies (verified) Penicillins, Other, and Sulfa antibiotics   History: Past Medical History:  Diagnosis Date   CAD (coronary artery disease)    stents to proximal LAD and mid RCA   Hyperlipidemia    Hypertension    Hypothyroidism    IBS (irritable bowel syndrome)    Osteoarthritis    PVD (peripheral vascular disease) (HCC)    SVT (supraventricular tachycardia) (HCC)    Past Surgical  History:  Procedure Laterality Date   ABDOMINAL HYSTERECTOMY     ANTERIOR FUSION CERVICAL SPINE     twice   CHOLECYSTECTOMY     CORONARY ANGIOPLASTY WITH STENT PLACEMENT     proximal LAD and mid RCA   LUMBAR DISC SURGERY     Pace maker  11/06/2016   Medtronic Boston Scietific Pacemaker Model L311/ Serial [id number]   THYROIDECTOMY Left    Family History  Problem Relation Age of Onset   Cancer Mother    Hearing loss Father    Heart attack Father    Hypertension Sister    Heart disease Sister    Diabetes Sister    Pancreatic cancer Sister    Liver cancer Sister    Heart  disease Brother    Lung disease Brother    Social History   Socioeconomic History   Marital status: Widowed    Spouse name: Not on file   Number of children: 4   Years of education: Not on file   Highest education level: Not on file  Occupational History   Occupation: Retired  Tobacco Use   Smoking status: Never   Smokeless tobacco: Never  Vaping Use   Vaping status: Never Used  Substance and Sexual Activity   Alcohol use: No   Drug use: Never   Sexual activity: Not on file  Other Topics Concern   Not on file  Social History Narrative   1 stillborn child   Social Determinants of Health   Financial Resource Strain: Low Risk  (01/14/2023)   Overall Financial Resource Strain (CARDIA)    Difficulty of Paying Living Expenses: Not hard at all  Food Insecurity: No Food Insecurity (01/14/2023)   Hunger Vital Sign    Worried About Running Out of Food in the Last Year: Never true    Ran Out of Food in the Last Year: Never true  Transportation Needs: No Transportation Needs (01/14/2023)   PRAPARE - Administrator, Civil Service (Medical): No    Lack of Transportation (Non-Medical): No  Physical Activity: Insufficiently Active (01/14/2023)   Exercise Vital Sign    Days of Exercise per Week: 2 days    Minutes of Exercise per Session: 20 min  Stress: No Stress Concern Present (01/14/2023)   [employer] of Occupational Health - Occupational Stress Questionnaire    Feeling of Stress : Not at all  Social Connections: Moderately Isolated (01/14/2023)   Social Connection and Isolation Panel [NHANES]    Frequency of Communication with Friends and Family: More than three times a week    Frequency of Social Gatherings with Friends and Family: More than three times a week    Attends Religious Services: More than 4 times per year    Active Member of [employer] or Organizations: No    Attends Banker Meetings: Never    Marital Status: Widowed    Tobacco  Counseling Counseling given: Not Answered   Clinical Intake:  Pre-visit preparation completed: Yes  Pain : 0-10 Pain Score: 3  Pain Type: Chronic pain Pain Location: Back Pain Orientation: Lower Pain Descriptors / Indicators: Aching, Discomfort Pain Onset: More than a month ago Pain Frequency: Constant Pain Relieving Factors: sitting down  Pain Relieving Factors: sitting down  BMI - recorded: 34.2 Nutritional Status: BMI > 30  Obese Nutritional Risks: None Diabetes: No  How often do you need to have someone help you when you read instructions, pamphlets, or other written materials from your doctor  or pharmacy?: 1 - Never  Interpreter Needed?: No  Information entered by :: Kennedy Bucker, LPN   Activities of Daily Living    01/14/2023   10:26 AM  In your present state of health, do you have any difficulty performing the following activities:  Hearing? 0  Vision? 0  Difficulty concentrating or making decisions? 0  Walking or climbing stairs? 0  Dressing or bathing? 0  Doing errands, shopping? 1  Preparing Food and eating ? N  Using the Toilet? N  In the past six months, have you accidently leaked urine? N  Do you have problems with loss of bowel control? N  Managing your Medications? N  Managing your Finances? N  Housekeeping or managing your Housekeeping? Y    Patient Care Team: Reubin Milan, MD as PCP - General (Internal Medicine) Lanier Prude, MD as PCP - Electrophysiology (Cardiology)  Indicate any recent Medical Services you may have received from other than [facility] providers in the past year (date may be approximate).     Assessment:   This is a routine wellness examination for [facility].  Hearing/Vision screen Hearing Screening - Comments:: No aids Vision Screening - Comments:: No glasses, had cataract surgery- [place] MD   Goals Addressed             This Visit's Progress    DIET - EAT MORE FRUITS AND VEGETABLES         Depression  Screen    01/14/2023   10:23 AM 10/08/2022   10:01 AM 03/26/2022   10:28 AM 01/13/2022   10:15 AM 12/04/2021    4:58 PM  PHQ 2/9 Scores  PHQ - 2 Score 0 0 0 0 0  PHQ- 9 Score 0 2 0 3 0    Fall Risk    01/14/2023   10:26 AM 10/08/2022   10:01 AM 03/26/2022   10:28 AM 01/13/2022   10:16 AM 12/04/2021    4:59 PM  Fall Risk   Falls in the past year? 0 0 0 0 0  Number falls in past yr: 0 0 0 0 0  Injury with Fall? 0 0 0 0 0  Risk for fall due to : No Fall Risks No Fall Risks No Fall Risks No Fall Risks No Fall Risks  Follow up Falls prevention discussed;Falls evaluation completed Falls evaluation completed Falls evaluation completed Falls evaluation completed Falls evaluation completed    MEDICARE RISK AT HOME: Medicare Risk at Home Any stairs in or around the home?: Yes If so, are there any without handrails?: No Home free of loose throw rugs in walkways, pet beds, electrical cords, etc?: Yes Adequate lighting in your home to reduce risk of falls?: Yes Life alert?: No Use of a cane, walker or w/c?: Yes (cane when goes out, walker in house) Grab bars in the bathroom?: Yes Shower chair or bench in shower?: Yes Elevated toilet seat or a handicapped toilet?: No  TIMED UP AND GO:  Was the test performed?  No    Cognitive Function:        01/14/2023   10:28 AM 01/13/2022   10:17 AM  6CIT Screen  What Year? 0 points 0 points  What month? 0 points 0 points  What time? 0 points 0 points  Count back from 20 0 points 0 points  Months in reverse 0 points 0 points  Repeat phrase 2 points 4 points  Total Score 2 points 4 points    Immunizations Immunization History  Administered Date(s) Administered   Fluad Quad(high Dose 65+) 11/13/2021   Influenza,inj,Quad PF,6+ Mos 11/10/2016, 11/10/2017, 01/17/2020   Influenza-Unspecified 11/01/2022   Moderna Sars-Covid-2 Vaccination 03/17/2019   PNEUMOCOCCAL CONJUGATE-20 10/08/2022   Pfizer(Comirnaty)Fall Seasonal Vaccine 12 years  and older 03/09/2019, 03/17/2019, 04/04/2019   Pneumococcal Conjugate-13 02/10/2015   Pneumococcal Polysaccharide-23 02/25/2007   Tdap 01/16/2022   Zoster Recombinant(Shingrix) 11/13/2021, 01/16/2022    TDAP status: Up to date  Flu Vaccine status: Up to date  Pneumococcal vaccine status: Up to date  Covid-19 vaccine status: Completed vaccines  Qualifies for Shingles Vaccine? Yes   Zostavax completed No   Shingrix Completed?: Yes  Screening Tests Health Maintenance  Topic Date Due   COVID-19 Vaccine (5 - 2023-24 season) 10/26/2022   Medicare Annual Wellness (AWV)  01/14/2024   DTaP/Tdap/Td (2 - Td or Tdap) 01/17/2032   Pneumonia Vaccine 34+ Years old  Completed   INFLUENZA VACCINE  Completed   DEXA SCAN  Completed   Zoster Vaccines- Shingrix  Completed   HPV VACCINES  Aged Out    Health Maintenance  Health Maintenance Due  Topic Date Due   COVID-19 Vaccine (5 - 2023-24 season) 10/26/2022    Colorectal cancer screening: No longer required.   Mammogram status: No longer required due to age.   Lung Cancer Screening: (Low Dose CT Chest recommended if Age 87-80 years, 20 pack-year currently smoking OR have quit w/in 15years.) does not qualify.    Additional Screening:  Hepatitis C Screening: does not qualify; Completed no  Vision Screening: Recommended annual ophthalmology exams for early detection of glaucoma and other disorders of the eye. Is the patient up to date with their annual eye exam?  Yes  Who is the provider or what is the name of the office in which the patient attends annual eye exams? [facility] in [place] If pt is not established with a provider, would they like to be referred to a provider to establish care? No .   Dental Screening: Recommended annual dental exams for proper oral hygiene   Community Resource Referral / Chronic Care Management: CRR required this visit?  No   CCM required this visit?  No     Plan:     I have personally reviewed  and noted the following in the patient's chart:   Medical and social history Use of alcohol, tobacco or illicit drugs  Current medications and supplements including opioid prescriptions. Patient is not currently taking opioid prescriptions. Functional ability and status Nutritional status Physical activity Advanced directives List of other physicians Hospitalizations, surgeries, and ER visits in previous 12 months Vitals Screenings to include cognitive, depression, and falls Referrals and appointments  In addition, I have reviewed and discussed with patient certain preventive protocols, quality metrics, and best practice recommendations. A written personalized care plan for preventive services as well as general preventive health recommendations were provided to patient.     Hal Hope, LPN   16/11/9602   After Visit Summary: (MyChart) Due to this being a telephonic visit, the after visit summary with patients personalized plan was offered to patient via MyChart   Nurse Notes: none

## 2023-01-14 NOTE — Patient Instructions (Signed)
Medication Instructions:   Your physician recommends that you continue on your current medications as directed. Please refer to the Current Medication list given to you today.  *If you need a refill on your cardiac medications before your next appointment, please call your pharmacy*   Lab Work:  NONE  If you have labs (blood work) drawn today and your tests are completely normal, you will receive your results only by: MyChart Message (if you have MyChart) OR A paper copy in the mail If you have any lab test that is abnormal or we need to change your treatment, we will call you to review the results.   Testing/Procedures:  NONE   Follow-Up: At [facility], you and your health needs are our priority.  As part of our continuing mission to provide you with exceptional heart care, we have created designated Provider Care Teams.  These Care Teams include your primary Cardiologist (physician) and Advanced Practice Providers (APPs -  Physician Assistants and Nurse Practitioners) who all work together to provide you with the care you need, when you need it.  We recommend signing up for the patient portal called "MyChart".  Sign up information is provided on this After Visit Summary.  MyChart is used to connect with patients for Virtual Visits (Telemedicine).  Patients are able to view lab/test results, encounter notes, upcoming appointments, etc.  Non-urgent messages can be sent to your provider as well.   To learn more about what you can do with MyChart, go to [email or web address].    Your next appointment:   6 month(s)  Provider:   Sherie Don, NP

## 2023-01-14 NOTE — Patient Instructions (Addendum)
[name] , Thank you for taking time to come for your Medicare Wellness Visit. I appreciate your ongoing commitment to your health goals. Please review the following plan we discussed and let me know if I can assist you in the future.   Referrals/Orders/Follow-Ups/Clinician Recommendations: none  This is a list of the screening recommended for you and due dates:  Health Maintenance  Topic Date Due   COVID-19 Vaccine (5 - 2023-24 season) 10/26/2022   Medicare Annual Wellness Visit  01/14/2024   DTaP/Tdap/Td vaccine (2 - Td or Tdap) 01/17/2032   Pneumonia Vaccine  Completed   Flu Shot  Completed   DEXA scan (bone density measurement)  Completed   Zoster (Shingles) Vaccine  Completed   HPV Vaccine  Aged Out    Advanced directives: (ACP Link)Information on Advanced Care Planning can be found at [facility] Advance Health Care Directives Advance Health Care Directives ([email or web address]   Next Medicare Annual Wellness Visit scheduled for next year: Yes   01/20/24 @ 9:30 am by video

## 2023-02-19 ENCOUNTER — Ambulatory Visit: Payer: Medicare HMO

## 2023-02-19 DIAGNOSIS — I495 Sick sinus syndrome: Secondary | ICD-10-CM

## 2023-02-20 ENCOUNTER — Other Ambulatory Visit: Payer: Self-pay | Admitting: Cardiovascular Disease

## 2023-02-20 DIAGNOSIS — I495 Sick sinus syndrome: Secondary | ICD-10-CM

## 2023-02-21 LAB — CUP PACEART REMOTE DEVICE CHECK
Battery Remaining Longevity: 30 mo
Battery Remaining Percentage: 51 %
Brady Statistic RA Percent Paced: 81 %
Brady Statistic RV Percent Paced: 2 %
Date Time Interrogation Session: 20241226015100
Implantable Lead Connection Status: 753985
Implantable Lead Connection Status: 753985
Implantable Lead Implant Date: 20180913
Implantable Lead Implant Date: 20180913
Implantable Lead Location: 753859
Implantable Lead Location: 753860
Implantable Lead Model: 4473
Implantable Lead Model: 7742
Implantable Lead Serial Number: 497240
Implantable Lead Serial Number: 841204
Implantable Pulse Generator Implant Date: 20180913
Lead Channel Impedance Value: 388 Ohm
Lead Channel Impedance Value: 917 Ohm
Lead Channel Pacing Threshold Amplitude: 0.4 V
Lead Channel Pacing Threshold Amplitude: 0.9 V
Lead Channel Pacing Threshold Pulse Width: 0.4 ms
Lead Channel Pacing Threshold Pulse Width: 0.4 ms
Lead Channel Setting Pacing Amplitude: 2 V
Lead Channel Setting Pacing Amplitude: 2 V
Lead Channel Setting Pacing Pulse Width: 0.4 ms
Lead Channel Setting Sensing Sensitivity: 2.5 mV
Pulse Gen Serial Number: 374129
Zone Setting Status: 755011

## 2023-03-04 ENCOUNTER — Encounter: Payer: Self-pay | Admitting: Nurse Practitioner

## 2023-03-04 ENCOUNTER — Ambulatory Visit: Payer: Medicare HMO | Attending: Nurse Practitioner | Admitting: Nurse Practitioner

## 2023-03-04 VITALS — BP 133/82 | HR 72 | Ht 60.0 in | Wt 171.2 lb

## 2023-03-04 DIAGNOSIS — I480 Paroxysmal atrial fibrillation: Secondary | ICD-10-CM

## 2023-03-04 DIAGNOSIS — I2510 Atherosclerotic heart disease of native coronary artery without angina pectoris: Secondary | ICD-10-CM | POA: Diagnosis not present

## 2023-03-04 DIAGNOSIS — E782 Mixed hyperlipidemia: Secondary | ICD-10-CM

## 2023-03-04 DIAGNOSIS — I10 Essential (primary) hypertension: Secondary | ICD-10-CM

## 2023-03-04 DIAGNOSIS — I495 Sick sinus syndrome: Secondary | ICD-10-CM | POA: Diagnosis not present

## 2023-03-04 NOTE — Progress Notes (Signed)
 Office Visit    Patient Name: Sandra Murphy Date of Encounter: 03/04/2023  Primary Care Provider:  Justus Leita DEL, MD Primary Cardiologist:  Evalene Lunger, MD  Chief Complaint    89 y.o. female w/ a h/o CAD, tachybrady syndrome status post permanent pacemaker, PVCs, PACs, hypertension, hyperlipidemia, DVT, and anxiety, who presents for CAD follow-up.  Past Medical History  Subjective   Past Medical History:  Diagnosis Date   CAD (coronary artery disease)    stents to proximal LAD and mid RCA   Hyperlipidemia    Hypertension    Hypothyroidism    IBS (irritable bowel syndrome)    Osteoarthritis    PAF (paroxysmal atrial fibrillation) (HCC)    a. Subclinical - detected on device monitoring-->sotalol .  No OAC given low-burden.   PVD (peripheral vascular disease) (HCC)    SVT (supraventricular tachycardia) (HCC)    Tachy-brady syndrome (HCC)    a. 10/2016 s/p BSX L311 Accolade MRI DC PPM.   Past Surgical History:  Procedure Laterality Date   ABDOMINAL HYSTERECTOMY     ANTERIOR FUSION CERVICAL SPINE     twice   CHOLECYSTECTOMY     CORONARY ANGIOPLASTY WITH STENT PLACEMENT     proximal LAD and mid RCA   LUMBAR DISC SURGERY     Pace maker  11/06/2016   Medtronic Boston Scietific Pacemaker Model L311/ Serial [phone]   THYROIDECTOMY Left     Allergies  Allergies  Allergen Reactions   Penicillins Hives   Other Other (See Comments)   Sulfa Antibiotics Nausea Only, Other (See Comments) and Diarrhea    HEADACHE       History of Present Illness      89 y.o. y/o female w/ a h/o CAD, tachybrady syndrome status post permanent pacemaker, PVCs, PACs, subclinical atrial fibrillation, hypertension, hyperlipidemia, DVT (2022), and anxiety.  She previously underwent stenting of the proximal LAD and mid RCA in 2013.  In October 2017, she underwent event monitoring in the setting of palpitations, which showed frequent atrial and ventricular ectopy, SVT, and nonsustained VT.  She  had a 15% PAC burden and 2.4% PVC burden.   Most recent diagnostic catheterization in May 2018 showed patent LAD and RCA stents with otherwise nonobstructive disease.  In the setting of ongoing palpitations, 30-day event monitor in June 2018 showed sinus rhythm with paroxysmal atrial fibrillation.  In the setting of tachybradycardia syndrome, she underwent [employer] dual-chamber permanent pacemaker placement in September 2018.  She has been on sotalol  therapy for frequent PACs.    Sandra Murphy established care with Dr. Gollan in January 2024.  She has since been comanaged with our electrophysiology team.  In October 2024, she was evaluated for chest pain in the [facility] emergency department.  At office follow-up November 2024, she was offered stress testing but deferred.  At electrophysiology follow-up in January 14, 2023, she was noted to have less than 1% A-fib burden and device adjustments were made to further monitor her A-fib episodes.  Most recent remote transition on February 19, 2023, showed approximately 2% atrial high rate burden with EGM showing atrial tachycardia/flutter, and she was maintained on sotalol , aspirin, and Plavix .  Sandra Murphy has done reasonably well since her last visit here.  She spent several weeks with family in [place]  over the holidays and just returned this past Saturday.  When she went to bed on Saturday night, she had about a 30-minute episode of tachycardia, which eventually resolved after turning onto her  left side.  She did not remember to send a remote device check.  She has had no recurrence of tachycardia.  She has chronic, stable dyspnea on exertion.  She denies chest pain, PND, orthopnea, dizziness, syncope, edema, or early satiety. Objective  Home Medications    Current Outpatient Medications  Medication Sig Dispense Refill   alendronate  (FOSAMAX ) 70 MG tablet Take 1 tablet by mouth once a week 12 tablet 3   Aspirin 81 MG CAPS Take 1 tablet by  mouth daily.     Calcium 200 MG TABS Take 600 mg by mouth 2 (two) times daily.     Cholecalciferol (VITAMIN D3) 250 MCG (10000 UT) CAPS Take by mouth.     clopidogrel  (PLAVIX ) 75 MG tablet Take 1 tablet (75 mg total) by mouth daily. 90 tablet 3   levothyroxine  (SYNTHROID ) 50 MCG tablet Take 1 tablet (50 mcg total) by mouth daily before breakfast. 90 tablet 0   LORazepam  (ATIVAN ) 0.5 MG tablet Take 1 tablet (0.5 mg total) by mouth 2 (two) times daily as needed. for anxiety 60 tablet 5   mirtazapine  (REMERON ) 30 MG tablet Take 1 tablet (30 mg total) by mouth daily. 90 tablet 1   NIFEdipine  (PROCARDIA -XL/NIFEDICAL-XL) 30 MG 24 hr tablet Take 1 tablet (30 mg) by mouth once daily 90 tablet 3   nitroGLYCERIN (NITROSTAT) 0.4 MG SL tablet Place under the tongue.     oxybutynin  (DITROPAN -XL) 10 MG 24 hr tablet Take 1 tablet by mouth once daily 90 tablet 3   pantoprazole  (PROTONIX ) 40 MG tablet Take 1 tablet (40 mg total) by mouth 2 (two) times daily. 180 tablet 1   pravastatin  (PRAVACHOL ) 40 MG tablet Take 1 tablet (40 mg total) by mouth daily. 90 tablet 3   sacubitril -valsartan  (ENTRESTO ) 24-26 MG Take 1 tablet by mouth 2 (two) times daily. 180 tablet 3   SOTALOL  AF 80 MG TABS Take 1 tablet by mouth twice daily 180 tablet 0   traMADol (ULTRAM) 50 MG tablet Take 50 mg by mouth every 6 (six) hours as needed. (Patient not taking: Reported on 03/04/2023)     No current facility-administered medications for this visit.     Physical Exam    VS:  BP 133/82 (BP Location: Left Arm, Patient Position: Sitting, Cuff Size: Normal)   Pulse 72   Ht 5' (1.524 m)   Wt 171 lb 3.2 oz (77.7 kg)   SpO2 96%   BMI 33.44 kg/m  , BMI Body mass index is 33.44 kg/m.       GEN: Well nourished, well developed, in no acute distress. HEENT: normal. Neck: Supple, no JVD, carotid bruits, or masses. Cardiac: RRR, no murmurs, rubs, or gallops. No clubbing, cyanosis, edema.  Radials 2+/PT 2+ and equal bilaterally.   Respiratory:  Respirations regular and unlabored, clear to auscultation bilaterally. GI: Soft, nontender, nondistended, BS + x 4. MS: no deformity or atrophy. Skin: warm and dry, no rash. Neuro:  Strength and sensation are intact. Psych: Normal affect.  Accessory Clinical Findings    ECG personally reviewed by me today - EKG Interpretation Date/Time:  Wednesday March 04 2023 15:20:49 EST Ventricular Rate:  72 PR Interval:  272 QRS Duration:  82 QT Interval:  438 QTC Calculation: 479 R Axis:   7  Text Interpretation: Atrial-paced rhythm with prolonged AV conduction Nonspecific ST and T wave abnormality Confirmed by Vivienne Bruckner [phone]) on 03/04/2023 3:31:37 PM  - no acute changes.  Lab Results  Component Value Date  WBC 6.4 07/27/2022   HGB 14.2 07/27/2022   HCT 41.5 07/27/2022   MCV 89.4 07/27/2022   PLT 248 07/27/2022   Lab Results  Component Value Date   CREATININE 0.90 10/08/2022   BUN 13 10/08/2022   NA 139 10/08/2022   K 5.2 10/08/2022   CL 104 10/08/2022   CO2 22 10/08/2022   Lab Results  Component Value Date   ALT 12 10/08/2022   AST 17 10/08/2022   ALKPHOS 85 10/08/2022   BILITOT 0.4 10/08/2022   Lab Results  Component Value Date   CHOL 173 10/08/2022   HDL 52 10/08/2022   LDLCALC 85 10/08/2022   TRIG 219 (H) 10/08/2022   CHOLHDL 3.3 10/08/2022    Lab Results  Component Value Date   TSH 1.370 10/08/2022       Assessment & Plan    1.  Coronary artery disease: Status post PCI drug-eluting stent placement to the proximal LAD and mid RCA in 2013.  Most recent cath in May 2019 showed patent stents and nonobstructive disease.  She has been doing well without chest pain since her last visit.  She remains on aspirin, Plavix , and statin therapy.  2.  Tachybradycardia syndrome/frequent PACs/atrial tachycardia/flutter/subclinical atrial fibrillation: Managed by electrophysiology.  2% burden of atrial tachycardia/flutter noted on most recent device  evaluation in late December.  Had a 30-minute episode of tachycardia the other night.  Notes that this typically happens about once per month.  She remains on sotalol  therapy.  She is not anticoagulated in the setting subclinical nature and low A-fib/flutter burden.  3.  Primary hypertension: Stable on nifedipine .  4.  Hyperlipidemia: LDL of 85 in August 2024.  She remains on pravastatin  therapy.  In setting of advanced age, we will forego more aggressive therapy/LDL goal.  5.  Disposition: Follow-up with electrophysiology as planned in May 2025.  Lonni Meager, NP 03/04/2023, 3:42 PM

## 2023-03-04 NOTE — Patient Instructions (Signed)
 Medication Instructions:  No changes *If you need a refill on your cardiac medications before your next appointment, please call your pharmacy*   Lab Work: None ordered If you have labs (blood work) drawn today and your tests are completely normal, you will receive your results only by: MyChart Message (if you have MyChart) OR A paper copy in the mail If you have any lab test that is abnormal or we need to change your treatment, we will call you to review the results.   Testing/Procedures: None ordered   Follow-Up: At [facility], you and your health needs are our priority.  As part of our continuing mission to provide you with exceptional heart care, we have created designated Provider Care Teams.  These Care Teams include your primary Cardiologist (physician) and Advanced Practice Providers (APPs -  Physician Assistants and Nurse Practitioners) who all work together to provide you with the care you need, when you need it.  We recommend signing up for the patient portal called MyChart.  Sign up information is provided on this After Visit Summary.  MyChart is used to connect with patients for Virtual Visits (Telemedicine).  Patients are able to view lab/test results, encounter notes, upcoming appointments, etc.  Non-urgent messages can be sent to your provider as well.   To learn more about what you can do with MyChart, go to [email or web address].    Your next appointment:   Follow up in May with Suzann Riddle, NP

## 2023-03-20 ENCOUNTER — Other Ambulatory Visit: Payer: Self-pay | Admitting: Internal Medicine

## 2023-03-20 NOTE — Telephone Encounter (Signed)
Requested Prescriptions  Pending Prescriptions Disp Refills   levothyroxine (SYNTHROID) 50 MCG tablet [Pharmacy Med Name: Levothyroxine Sodium 50 MCG Oral Tablet] 90 tablet 0    Sig: TAKE 1 TABLET BY MOUTH ONCE DAILY BEFORE BREAKFAST     Endocrinology:  Hypothyroid Agents Passed - 03/20/2023  3:16 PM      Passed - TSH in normal range and within 360 days    TSH  Date Value Ref Range Status  10/08/2022 1.370 0.450 - 4.500 uIU/mL Final         Passed - Valid encounter within last 12 months    Recent Outpatient Visits           5 months ago Annual physical exam   [facility] [facility], Nyoka Cowden, MD   11 months ago GERD without esophagitis   [place] [facility] at [facility], Nyoka Cowden, MD   1 year ago Atherosclerosis of native coronary artery of native heart without angina pectoris   [facility] at [facility], Nyoka Cowden, MD       Future Appointments             In 2 weeks Judithann Graves, Nyoka Cowden, MD [facility] at [facility], [facility]   In 3 months Sherie Don, NP [facility] at [facility]

## 2023-04-06 ENCOUNTER — Other Ambulatory Visit: Payer: Self-pay | Admitting: Cardiovascular Disease

## 2023-04-09 ENCOUNTER — Encounter: Payer: Self-pay | Admitting: Internal Medicine

## 2023-04-09 ENCOUNTER — Other Ambulatory Visit: Payer: Self-pay | Admitting: Internal Medicine

## 2023-04-09 ENCOUNTER — Ambulatory Visit: Payer: Medicare HMO | Admitting: Internal Medicine

## 2023-04-09 VITALS — BP 112/76 | HR 70 | Ht 60.0 in | Wt 175.0 lb

## 2023-04-09 DIAGNOSIS — F411 Generalized anxiety disorder: Secondary | ICD-10-CM

## 2023-04-09 DIAGNOSIS — M5136 Other intervertebral disc degeneration, lumbar region with discogenic back pain only: Secondary | ICD-10-CM | POA: Diagnosis not present

## 2023-04-09 DIAGNOSIS — E039 Hypothyroidism, unspecified: Secondary | ICD-10-CM | POA: Diagnosis not present

## 2023-04-09 DIAGNOSIS — K219 Gastro-esophageal reflux disease without esophagitis: Secondary | ICD-10-CM

## 2023-04-09 MED ORDER — MIRTAZAPINE 30 MG PO TABS: 30.0000 mg | ORAL_TABLET | Freq: Every day | ORAL | 1 refills | Status: DC

## 2023-04-09 MED ORDER — PANTOPRAZOLE SODIUM 40 MG PO TBEC: 40.0000 mg | DELAYED_RELEASE_TABLET | Freq: Two times a day (BID) | ORAL | 1 refills | Status: DC

## 2023-04-09 MED ORDER — GABAPENTIN 100 MG PO CAPS: 100.0000 mg | ORAL_CAPSULE | Freq: Every day | ORAL | 0 refills | Status: DC

## 2023-04-09 NOTE — Assessment & Plan Note (Signed)
Clinically stable on Remeron and prn Ativan with good response, No SI or HI reported. No change in management at this time.

## 2023-04-09 NOTE — Progress Notes (Signed)
Date:  04/09/2023   Name:  Sandra Murphy   DOB:  01-Oct-1933   MRN:  [id number]   Chief Complaint: Hypertension and Anxiety  Anxiety Presents for follow-up visit. Symptoms include excessive worry and nervous/anxious behavior. Patient reports no chest pain, depressed mood, dizziness, feeling of choking, hyperventilation, shortness of breath or suicidal ideas.    Gastroesophageal Reflux She complains of dysphagia and heartburn. She reports no abdominal pain, no chest pain or no wheezing. This is a recurrent problem. The problem has been unchanged. The heartburn duration is several minutes. The heartburn is located in the substernum. Pertinent negatives include no fatigue or weight loss. Risk factors include hiatal hernia (and esophageal dysmotility). She has tried a PPI for the symptoms.  Thyroid Problem Presents for follow-up visit. Symptoms include anxiety. Patient reports no constipation, depressed mood, fatigue, leg swelling, weight gain or weight loss. The symptoms have been stable.    Review of Systems  Constitutional:  Negative for chills, fatigue, fever, unexpected weight change, weight gain and weight loss.  HENT:  Positive for congestion and trouble swallowing.   Respiratory:  Negative for chest tightness, shortness of breath and wheezing.   Cardiovascular:  Negative for chest pain.  Gastrointestinal:  Positive for dysphagia and heartburn. Negative for abdominal pain and constipation.  Musculoskeletal:  Positive for arthralgias and back pain.  Neurological:  Negative for dizziness and headaches.  Psychiatric/Behavioral:  Negative for dysphoric mood, sleep disturbance and suicidal ideas. The patient is nervous/anxious.      Lab Results  Component Value Date   NA 139 10/08/2022   K 5.2 10/08/2022   CO2 22 10/08/2022   GLUCOSE 94 10/08/2022   BUN 13 10/08/2022   CREATININE 0.90 10/08/2022   CALCIUM 9.2 10/08/2022   EGFR 62 10/08/2022   GFRNONAA 58 (L) 07/27/2022   Lab  Results  Component Value Date   CHOL 173 10/08/2022   HDL 52 10/08/2022   LDLCALC 85 10/08/2022   TRIG 219 (H) 10/08/2022   CHOLHDL 3.3 10/08/2022   Lab Results  Component Value Date   TSH 1.370 10/08/2022   No results found for: "HGBA1C" Lab Results  Component Value Date   WBC 6.4 07/27/2022   HGB 14.2 07/27/2022   HCT 41.5 07/27/2022   MCV 89.4 07/27/2022   PLT 248 07/27/2022   Lab Results  Component Value Date   ALT 12 10/08/2022   AST 17 10/08/2022   ALKPHOS 85 10/08/2022   BILITOT 0.4 10/08/2022   No results found for: "25OHVITD2", "25OHVITD3", "VD25OH"   Patient Active Problem List   Diagnosis Date Noted   Essential hypertension 10/08/2022   Tachy-brady syndrome (HCC) 12/04/2021   GERD without esophagitis 12/04/2021   Generalized anxiety disorder 12/04/2021   Degenerative disc disease, lumbar 12/04/2021   OAB (overactive bladder) 12/04/2021   Acquired hypothyroidism 12/04/2021   Mixed hyperlipidemia 08/08/2021   Hx of deep venous thrombosis 08/08/2021   Osteoporosis 08/08/2021   PVD (peripheral vascular disease) (HCC) 08/08/2021   Hx of heart artery stent 08/08/2021   Hiatal hernia 08/08/2021   Asthma 11/14/2020   Atherosclerotic heart disease of native coronary artery without angina pectoris 01/10/2016   PVCs (premature ventricular contractions) 01/10/2016    Allergies  Allergen Reactions   Penicillins Hives   Other Other (See Comments)   Sulfa Antibiotics Nausea Only, Other (See Comments) and Diarrhea    HEADACHE     Past Surgical History:  Procedure Laterality Date   ABDOMINAL HYSTERECTOMY  ANTERIOR FUSION CERVICAL SPINE     twice   CHOLECYSTECTOMY     CORONARY ANGIOPLASTY WITH STENT PLACEMENT     proximal LAD and mid RCA   LUMBAR DISC SURGERY     Pace maker  11/06/2016   Medtronic [employer] Scietific Pacemaker Model L311/ Serial [id number]   THYROIDECTOMY Left     Social History   Tobacco Use   Smoking status: Never   Smokeless  tobacco: Never  Vaping Use   Vaping status: Never Used  Substance Use Topics   Alcohol use: No   Drug use: Never     Medication list has been reviewed and updated.  Current Meds  Medication Sig   Acetaminophen (TYLENOL PO) Take by mouth as needed.   alendronate (FOSAMAX) 70 MG tablet Take 1 tablet by mouth once a week   Aspirin 81 MG CAPS Take 1 tablet by mouth daily.   Calcium 200 MG TABS Take 600 mg by mouth 2 (two) times daily.   Cholecalciferol (VITAMIN D3) 250 MCG (10000 UT) CAPS Take by mouth.   clopidogrel (PLAVIX) 75 MG tablet Take 1 tablet (75 mg total) by mouth daily.   gabapentin (NEURONTIN) 100 MG capsule Take 1 capsule (100 mg total) by mouth at bedtime.   levothyroxine (SYNTHROID) 50 MCG tablet TAKE 1 TABLET BY MOUTH ONCE DAILY BEFORE BREAKFAST   LORazepam (ATIVAN) 0.5 MG tablet Take 1 tablet (0.5 mg total) by mouth 2 (two) times daily as needed. for anxiety   NIFEdipine (PROCARDIA-XL/NIFEDICAL-XL) 30 MG 24 hr tablet TAKE ONE TABLET (30 MG) BY MOUTH ONCE DAILY.   nitroGLYCERIN (NITROSTAT) 0.4 MG SL tablet Place under the tongue.   oxybutynin (DITROPAN-XL) 10 MG 24 hr tablet Take 1 tablet by mouth once daily   pravastatin (PRAVACHOL) 40 MG tablet Take 1 tablet (40 mg total) by mouth daily.   sacubitril-valsartan (ENTRESTO) 24-26 MG Take 1 tablet by mouth 2 (two) times daily.   SOTALOL AF 80 MG TABS Take 1 tablet by mouth twice daily   [DISCONTINUED] mirtazapine (REMERON) 30 MG tablet Take 1 tablet (30 mg total) by mouth daily.   [DISCONTINUED] pantoprazole (PROTONIX) 40 MG tablet Take 1 tablet (40 mg total) by mouth 2 (two) times daily.       04/09/2023   10:04 AM 10/08/2022   10:02 AM 03/26/2022   10:28 AM 12/04/2021    4:59 PM  GAD 7 : Generalized Anxiety Score  Nervous, Anxious, on Edge 0 0 1 0  Control/stop worrying 0 0 0 0  Worry too much - different things 0 0 0 0  Trouble relaxing 0 0 0 0  Restless 0 0 0 0  Easily annoyed or irritable 0 0 0 0  Afraid -  awful might happen 0 0 0 0  Total GAD 7 Score 0 0 1 0  Anxiety Difficulty Not difficult at all Not difficult at all Not difficult at all Not difficult at all       04/09/2023   10:04 AM 01/14/2023   10:23 AM 10/08/2022   10:01 AM  Depression screen PHQ 2/9  Decreased Interest 0 0 0  Down, Depressed, Hopeless 0 0 0  PHQ - 2 Score 0 0 0  Altered sleeping  0 0  Tired, decreased energy  0 0  Change in appetite  0 2  Feeling bad or failure about yourself   0 0  Trouble concentrating  0 0  Moving slowly or fidgety/restless  0 0  Suicidal thoughts  0 0  PHQ-9 Score  0 2  Difficult doing work/chores  Not difficult at all Not difficult at all    BP Readings from Last 3 Encounters:  04/09/23 112/76  03/04/23 133/82  01/14/23 118/70    Physical Exam Vitals and nursing note reviewed.  Constitutional:      General: She is not in acute distress.    Appearance: Normal appearance. She is well-developed.  HENT:     Head: Normocephalic and atraumatic.  Cardiovascular:     Rate and Rhythm: Normal rate and regular rhythm.     Heart sounds: No murmur heard. Pulmonary:     Effort: Pulmonary effort is normal. No respiratory distress.     Breath sounds: No wheezing or rhonchi.  Abdominal:     General: There is no distension.     Palpations: There is no mass.     Tenderness: There is no abdominal tenderness.  Musculoskeletal:     Cervical back: Normal range of motion.     Right lower leg: No edema.     Left lower leg: No edema.  Lymphadenopathy:     Cervical: No cervical adenopathy.  Skin:    General: Skin is warm and dry.     Findings: No rash.  Neurological:     Mental Status: She is alert and oriented to person, place, and time.  Psychiatric:        Mood and Affect: Mood normal.        Behavior: Behavior normal.     Wt Readings from Last 3 Encounters:  04/09/23 175 lb (79.4 kg)  03/04/23 171 lb 3.2 oz (77.7 kg)  01/14/23 175 lb 2 oz (79.4 kg)    BP 112/76   Pulse 70    Ht 5' (1.524 m)   Wt 175 lb (79.4 kg)   SpO2 95%   BMI 34.18 kg/m   Assessment and Plan:  Problem List Items Addressed This Visit       Unprioritized   GERD without esophagitis (Chronic)   Reflux symptoms are minimal on current therapy - PPI. No red flag signs such as weight loss, n/v, melena.  She has to regurgitate food about once a month. Ba swallow last summer: 1. Moderate to severe esophageal dysmotility, likely presbyesophagus  2.  Large hiatal hernia  3.  Gastroesophageal reflux  4.  Tortuous distal esophagus High risk for surgery so will monitor symptoms and refer if needed.       Relevant Medications   pantoprazole (PROTONIX) 40 MG tablet   Generalized anxiety disorder - Primary (Chronic)   Clinically stable on Remeron and prn Ativan with good response, No SI or HI reported. No change in management at this time.       Relevant Medications   mirtazapine (REMERON) 30 MG tablet   Acquired hypothyroidism (Chronic)   Supplemented. Lab Results  Component Value Date   TSH 1.370 10/08/2022         Degenerative disc disease, lumbar   Ongoing back pain - s/p several surgeries On tylenol; tried Tramadol without benefit Will try gabapentin 100 mg at bedtime      Relevant Medications   Acetaminophen (TYLENOL PO)   gabapentin (NEURONTIN) 100 MG capsule    Return in about 6 months (around 10/07/2023) for CPX.    Reubin Milan, MD [facility] [place]

## 2023-04-09 NOTE — Assessment & Plan Note (Signed)
Supplemented. Lab Results  Component Value Date   TSH 1.370 10/08/2022

## 2023-04-09 NOTE — Progress Notes (Unsigned)
Date:  04/09/2023   Name:  Sandra Murphy   DOB:  04-May-1933   MRN:  [id number]   Chief Complaint: No chief complaint on file.  HPI  Review of Systems   Lab Results  Component Value Date   NA 139 10/08/2022   K 5.2 10/08/2022   CO2 22 10/08/2022   GLUCOSE 94 10/08/2022   BUN 13 10/08/2022   CREATININE 0.90 10/08/2022   CALCIUM 9.2 10/08/2022   EGFR 62 10/08/2022   GFRNONAA 58 (L) 07/27/2022   Lab Results  Component Value Date   CHOL 173 10/08/2022   HDL 52 10/08/2022   LDLCALC 85 10/08/2022   TRIG 219 (H) 10/08/2022   CHOLHDL 3.3 10/08/2022   Lab Results  Component Value Date   TSH 1.370 10/08/2022   No results found for: "HGBA1C" Lab Results  Component Value Date   WBC 6.4 07/27/2022   HGB 14.2 07/27/2022   HCT 41.5 07/27/2022   MCV 89.4 07/27/2022   PLT 248 07/27/2022   Lab Results  Component Value Date   ALT 12 10/08/2022   AST 17 10/08/2022   ALKPHOS 85 10/08/2022   BILITOT 0.4 10/08/2022   No results found for: "25OHVITD2", "25OHVITD3", "VD25OH"   Patient Active Problem List   Diagnosis Date Noted   Essential hypertension 10/08/2022   Tachy-brady syndrome (HCC) 12/04/2021   GERD without esophagitis 12/04/2021   Generalized anxiety disorder 12/04/2021   Degenerative disc disease, lumbar 12/04/2021   OAB (overactive bladder) 12/04/2021   Acquired hypothyroidism 12/04/2021   Mixed hyperlipidemia 08/08/2021   Hx of deep venous thrombosis 08/08/2021   Osteoporosis 08/08/2021   PVD (peripheral vascular disease) (HCC) 08/08/2021   Hx of heart artery stent 08/08/2021   Hiatal hernia 08/08/2021   Asthma 11/14/2020   Atherosclerotic heart disease of native coronary artery without angina pectoris 01/10/2016   PVCs (premature ventricular contractions) 01/10/2016    Allergies  Allergen Reactions   Penicillins Hives   Other Other (See Comments)   Sulfa Antibiotics Nausea Only, Other (See Comments) and Diarrhea    HEADACHE     Past Surgical  History:  Procedure Laterality Date   ABDOMINAL HYSTERECTOMY     ANTERIOR FUSION CERVICAL SPINE     twice   CHOLECYSTECTOMY     CORONARY ANGIOPLASTY WITH STENT PLACEMENT     proximal LAD and mid RCA   LUMBAR DISC SURGERY     Pace maker  11/06/2016   Medtronic Boston Scietific Pacemaker Model L311/ Serial [id number]   THYROIDECTOMY Left     Social History   Tobacco Use   Smoking status: Never   Smokeless tobacco: Never  Vaping Use   Vaping status: Never Used  Substance Use Topics   Alcohol use: No   Drug use: Never     Medication list has been reviewed and updated.  No outpatient medications have been marked as taking for the 04/09/23 encounter (Orders Only) with Reubin Milan, MD.       10/08/2022   10:02 AM 03/26/2022   10:28 AM 12/04/2021    4:59 PM  GAD 7 : Generalized Anxiety Score  Nervous, Anxious, on Edge 0 1 0  Control/stop worrying 0 0 0  Worry too much - different things 0 0 0  Trouble relaxing 0 0 0  Restless 0 0 0  Easily annoyed or irritable 0 0 0  Afraid - awful might happen 0 0 0  Total GAD 7 Score 0 1 0  Anxiety Difficulty Not difficult at all Not difficult at all Not difficult at all       01/14/2023   10:23 AM 10/08/2022   10:01 AM 03/26/2022   10:28 AM  Depression screen PHQ 2/9  Decreased Interest 0 0 0  Down, Depressed, Hopeless 0 0 0  PHQ - 2 Score 0 0 0  Altered sleeping 0 0 0  Tired, decreased energy 0 0 0  Change in appetite 0 2 0  Feeling bad or failure about yourself  0 0 0  Trouble concentrating 0 0 0  Moving slowly or fidgety/restless 0 0 0  Suicidal thoughts 0 0 0  PHQ-9 Score 0 2 0  Difficult doing work/chores Not difficult at all Not difficult at all Not difficult at all    BP Readings from Last 3 Encounters:  03/04/23 133/82  01/14/23 118/70  01/02/23 120/74    Physical Exam  Wt Readings from Last 3 Encounters:  03/04/23 171 lb 3.2 oz (77.7 kg)  01/14/23 175 lb 2 oz (79.4 kg)  01/02/23 175 lb 3.2 oz (79.5 kg)     There were no vitals taken for this visit.  Assessment and Plan:  Problem List Items Addressed This Visit   None   No follow-ups on file.    Reubin Milan, MD [facility] [place]

## 2023-04-09 NOTE — Assessment & Plan Note (Addendum)
Reflux symptoms are minimal on current therapy - PPI. No red flag signs such as weight loss, n/v, melena.  She has to regurgitate food about once a month. Ba swallow last summer: 1. Moderate to severe esophageal dysmotility, likely presbyesophagus  2.  Large hiatal hernia  3.  Gastroesophageal reflux  4.  Tortuous distal esophagus High risk for surgery so will monitor symptoms and refer if needed.

## 2023-04-09 NOTE — Assessment & Plan Note (Signed)
Ongoing back pain - s/p several surgeries On tylenol; tried Tramadol without benefit Will try gabapentin 100 mg at bedtime

## 2023-04-21 ENCOUNTER — Other Ambulatory Visit: Payer: Self-pay | Admitting: Cardiovascular Disease

## 2023-04-22 ENCOUNTER — Other Ambulatory Visit: Payer: Self-pay | Admitting: Cardiovascular Disease

## 2023-05-05 ENCOUNTER — Other Ambulatory Visit: Payer: Self-pay | Admitting: Internal Medicine

## 2023-05-05 DIAGNOSIS — M5136 Other intervertebral disc degeneration, lumbar region with discogenic back pain only: Secondary | ICD-10-CM

## 2023-05-06 NOTE — Telephone Encounter (Signed)
 Requested Prescriptions  Pending Prescriptions Disp Refills   gabapentin (NEURONTIN) 100 MG capsule [Pharmacy Med Name: Gabapentin 100 MG Oral Capsule] 90 capsule 0    Sig: Take 1 capsule by mouth at bedtime     Neurology: Anticonvulsants - gabapentin Passed - 05/06/2023  9:32 AM      Passed - Cr in normal range and within 360 days    Creatinine, Ser  Date Value Ref Range Status  10/08/2022 0.90 0.57 - 1.00 mg/dL Final         Passed - Completed PHQ-2 or PHQ-9 in the last 360 days      Passed - Valid encounter within last 12 months    Recent Outpatient Visits           7 months ago Annual physical exam   [place] [facility] at [facility], Nyoka Cowden, MD   1 year ago GERD without esophagitis   [place] [facility] at [facility] Nyoka Cowden, MD   1 year ago Atherosclerosis of native coronary artery of native heart without angina pectoris   [facility] at [facility], Nyoka Cowden, MD       Future Appointments             In 2 months Sherie Don, NP [facility] [place]   In 5 months Judithann Graves, Nyoka Cowden, MD [facility] at [facility] [place]

## 2023-05-21 ENCOUNTER — Ambulatory Visit (INDEPENDENT_AMBULATORY_CARE_PROVIDER_SITE_OTHER): Payer: Medicare HMO

## 2023-05-21 DIAGNOSIS — I495 Sick sinus syndrome: Secondary | ICD-10-CM

## 2023-05-21 LAB — CUP PACEART REMOTE DEVICE CHECK
Battery Remaining Longevity: 30 mo
Battery Remaining Percentage: 46 %
Brady Statistic RA Percent Paced: 84 %
Brady Statistic RV Percent Paced: 2 %
Date Time Interrogation Session: 20250327015100
Implantable Lead Connection Status: 753985
Implantable Lead Connection Status: 753985
Implantable Lead Implant Date: 20180913
Implantable Lead Implant Date: 20180913
Implantable Lead Location: 753859
Implantable Lead Location: 753860
Implantable Lead Model: 4473
Implantable Lead Model: 7742
Implantable Lead Serial Number: 497240
Implantable Lead Serial Number: 841204
Implantable Pulse Generator Implant Date: 20180913
Lead Channel Impedance Value: 378 Ohm
Lead Channel Impedance Value: 885 Ohm
Lead Channel Pacing Threshold Amplitude: 0.4 V
Lead Channel Pacing Threshold Amplitude: 0.9 V
Lead Channel Pacing Threshold Pulse Width: 0.4 ms
Lead Channel Pacing Threshold Pulse Width: 0.4 ms
Lead Channel Setting Pacing Amplitude: 2 V
Lead Channel Setting Pacing Amplitude: 2 V
Lead Channel Setting Pacing Pulse Width: 0.4 ms
Lead Channel Setting Sensing Sensitivity: 2.5 mV
Pulse Gen Serial Number: 374129
Zone Setting Status: 755011

## 2023-05-22 ENCOUNTER — Other Ambulatory Visit: Payer: Self-pay | Admitting: Cardiovascular Disease

## 2023-05-22 DIAGNOSIS — I495 Sick sinus syndrome: Secondary | ICD-10-CM

## 2023-05-23 ENCOUNTER — Encounter: Payer: Self-pay | Admitting: Cardiology

## 2023-06-08 ENCOUNTER — Other Ambulatory Visit: Payer: Self-pay | Admitting: Cardiovascular Disease

## 2023-06-11 ENCOUNTER — Other Ambulatory Visit: Payer: Self-pay | Admitting: Internal Medicine

## 2023-06-11 DIAGNOSIS — F411 Generalized anxiety disorder: Secondary | ICD-10-CM

## 2023-06-12 NOTE — Telephone Encounter (Signed)
 Please review.  KP

## 2023-07-02 NOTE — Progress Notes (Signed)
 Remote pacemaker transmission.

## 2023-07-06 ENCOUNTER — Ambulatory Visit: Payer: Medicare HMO | Admitting: Cardiology

## 2023-07-07 ENCOUNTER — Ambulatory Visit: Admitting: Cardiology

## 2023-07-16 ENCOUNTER — Encounter: Payer: Self-pay | Admitting: Internal Medicine

## 2023-07-16 NOTE — Telephone Encounter (Signed)
 Please review. JM

## 2023-07-17 ENCOUNTER — Telehealth: Payer: Self-pay | Admitting: Cardiovascular Disease

## 2023-07-17 MED ORDER — NIFEDIPINE ER OSMOTIC RELEASE 30 MG PO TB24: ORAL_TABLET | ORAL | 2 refills | Status: DC

## 2023-07-17 NOTE — Telephone Encounter (Signed)
 Pt's medication was sent to pt's pharmacy as requested. Confirmation received.

## 2023-07-17 NOTE — Telephone Encounter (Signed)
*  STAT* If patient is at the pharmacy, call can be transferred to refill team.   1. Which medications need to be refilled? (please list name of each medication and dose if known)   NIFEdipine  (PROCARDIA -XL/NIFEDICAL-XL) 30 MG 24 hr tablet   2. Which pharmacy/location (including street and city if local pharmacy) is medication to be sent to? [facility] [address], [place] - [address] Phone: [phone]  Fax: [phone]       Significant History/Details     3. Do they need a 30 day or 90 day supply? 90

## 2023-08-05 ENCOUNTER — Other Ambulatory Visit: Payer: Self-pay | Admitting: Cardiovascular Disease

## 2023-08-18 NOTE — Progress Notes (Unsigned)
 Electrophysiology Clinic Note    Date:  08/19/2023  Patient ID:  Sandra Murphy, Sandra Murphy Aug 24, 1933, MRN [id number] PCP:  Justus Leita DEL, MD  Cardiologist:  Evalene Lunger, MD Electrophysiologist: OLE ONEIDA HOLTS, MD   Discussed the use of AI scribe software for clinical note transcription with the patient, who gave verbal consent to proceed.   Patient Profile    Chief Complaint: sotalol  follow-up  History of Present Illness: Sandra Murphy is a 89 y.o. female with PMH notable for tachy-brady s/p PPM, CAD sp PCI, PVCs, PACs, h/o DVT, SCAF, anxiety; seen today for OLE ONEIDA HOLTS, MD for routine electrophysiology followup.   She is on sotalol  for management of her palpitations, PACs, and PVCs.  She has subclinical A-fib with no plans to start OAC unless A-fib episodes increase in duration or overall burden.  I last saw her 12/2022 where she was feeling well.  On follow-up today, she has noticed that her edema has worsened lately. If she is able to elevated her legs all day, she does not have much swelling, but if she goes to the store they swell quickly with walking. She had good appetite, able to sleep flat, no SOB or coughing.  She denies chest pain, chest pressure, palpitaitons. She continues to take sotalol  BID.     Arrhythmia/Device History Bos Sci dual chamber PPM, imp 10/2016   AAD History: Sotalol        ROS:  Please see the history of present illness. All other systems are reviewed and otherwise negative.    Physical Exam    VS:  BP 132/72 (BP Location: Left Arm, Patient Position: Sitting, Cuff Size: Large)   Pulse 70   Ht 5' (1.524 m)   Wt 175 lb 6.4 oz (79.6 kg)   SpO2 97%   BMI 34.26 kg/m  BMI: Body mass index is 34.26 kg/m.      Wt Readings from Last 3 Encounters:  08/19/23 175 lb 6.4 oz (79.6 kg)  04/09/23 175 lb (79.4 kg)  03/04/23 171 lb 3.2 oz (77.7 kg)     GEN- The patient is well appearing, alert and oriented x 3 today.   Lungs- Clear to  ausculation bilaterally, normal work of breathing.  Heart- Regular rate and rhythm, no murmurs, rubs or gallops Extremities- 1+ peripheral edema, warm, dry Skin-  device pocket well-healed, no tethering   Device interrogation done today and reviewed by myself:  Battery 2 years Lead thresholds, impedence, sensing stable  Several AF episodes, longest 1.5 hours Adjusted RR settings   Studies Reviewed   Previous EP, cardiology notes.    EKG is ordered. Personal review of EKG from today shows:    EKG Interpretation Date/Time:  Wednesday August 19 2023 11:04:23 EDT Ventricular Rate:  70 PR Interval:  258 QRS Duration:  82 QT Interval:  432 QTC Calculation: 466 R Axis:   11  Text Interpretation: Atrial-paced rhythm with prolonged AV conduction ST & T wave abnormality, consider anterolateral ischemia Confirmed by Zerenity Bowron [phone]) on 08/19/2023 11:09:12 AM    TTE, 10/31/2022  1. Left ventricular ejection fraction, by estimation, is 55 to 60%. Left ventricular ejection fraction by 2D MOD biplane is 56.7 %. The left ventricle has normal function. The left ventricle has no regional wall motion abnormalities. There is mild left  ventricular hypertrophy. Left ventricular diastolic parameters are consistent with Grade II diastolic dysfunction (pseudonormalization).   2. Right ventricular systolic function is normal. The right ventricular size is  normal.   3. Left atrial size was mildly dilated.   4. The mitral valve is normal in structure. Mild mitral valve regurgitation.   5. The aortic valve is tricuspid. Aortic valve regurgitation is not visualized. Aortic valve sclerosis is present, with no evidence of aortic valve stenosis.   6. The inferior vena cava is normal in size with greater than 50% respiratory variability, suggesting right atrial pressure of 3 mmHg.    Assessment and Plan     #) tachy-brady syndrome #) Bos Sci PPM in place Device functioning well, see paceart for  details  #) SCAF #) PACs #) PVCs #) sotalol  monitoring EKG with stable QTC on sotalol  Continues to have sub-clinical AFib, no episodes longer than 24h Continue to monitor AF burden via PPM Update BMP, Mag today   #) Lower Extremity edema Most recent TTE with normal LVEF Does not appear in heart failure exacerbation Start 20mg  lasix PRN for increased swelling, has taken previously with positive results       Current medicines are reviewed at length with the patient today.   The patient does not have concerns regarding her medicines.  The following changes were made today:  none  Labs/ tests ordered today include:  Orders Placed This Encounter  Procedures   Basic metabolic panel with GFR   Magnesium   CBC   EKG 12-Lead     Disposition: Follow up with Dr. Cindie or EP APP in 6 months   Signed, Rorey Hodges, NP  08/19/23  12:19 PM  Electrophysiology [facility]

## 2023-08-19 ENCOUNTER — Ambulatory Visit: Attending: Cardiology | Admitting: Cardiology

## 2023-08-19 VITALS — BP 132/72 | HR 70 | Ht 60.0 in | Wt 175.4 lb

## 2023-08-19 DIAGNOSIS — I495 Sick sinus syndrome: Secondary | ICD-10-CM

## 2023-08-19 DIAGNOSIS — I480 Paroxysmal atrial fibrillation: Secondary | ICD-10-CM

## 2023-08-19 DIAGNOSIS — I493 Ventricular premature depolarization: Secondary | ICD-10-CM

## 2023-08-19 DIAGNOSIS — Z950 Presence of cardiac pacemaker: Secondary | ICD-10-CM

## 2023-08-19 DIAGNOSIS — Z79899 Other long term (current) drug therapy: Secondary | ICD-10-CM | POA: Diagnosis not present

## 2023-08-19 MED ORDER — FUROSEMIDE 20 MG PO TABS: 20.0000 mg | ORAL_TABLET | Freq: Every day | ORAL | 3 refills | Status: DC | PRN

## 2023-08-19 NOTE — Patient Instructions (Addendum)
 Medication Instructions:  Take Lasix 20 mg as needed for swelling  *If you need a refill on your cardiac medications before your next appointment, please call your pharmacy*  Lab Work: Your provider would like for you to have following labs drawn today BMET, MAG, CBC.   If you have labs (blood work) drawn today and your tests are completely normal, you will receive your results only by: MyChart Message (if you have MyChart) OR A paper copy in the mail If you have any lab test that is abnormal or we need to change your treatment, we will call you to review the results.  Follow-Up: At [facility], you and your health needs are our priority.  As part of our continuing mission to provide you with exceptional heart care, our providers are all part of one team.  This team includes your primary Cardiologist (physician) and Advanced Practice Providers or APPs (Physician Assistants and Nurse Practitioners) who all work together to provide you with the care you need, when you need it.  Your next appointment:   6 month(s)  Provider:   Ole Holts, MD    We recommend signing up for the patient portal called MyChart.  Sign up information is provided on this After Visit Summary.  MyChart is used to connect with patients for Virtual Visits (Telemedicine).  Patients are able to view lab/test results, encounter notes, upcoming appointments, etc.  Non-urgent messages can be sent to your provider as well.   To learn more about what you can do with MyChart, go to [email or web address].

## 2023-08-20 ENCOUNTER — Ambulatory Visit (INDEPENDENT_AMBULATORY_CARE_PROVIDER_SITE_OTHER): Payer: Medicare HMO

## 2023-08-20 ENCOUNTER — Ambulatory Visit: Payer: Self-pay | Admitting: Cardiology

## 2023-08-20 DIAGNOSIS — I495 Sick sinus syndrome: Secondary | ICD-10-CM

## 2023-08-20 LAB — CBC
Hematocrit: 41 % (ref 34.0–46.6)
Hemoglobin: 13.2 g/dL (ref 11.1–15.9)
MCH: 30.4 pg (ref 26.6–33.0)
MCHC: 32.2 g/dL (ref 31.5–35.7)
MCV: 95 fL (ref 79–97)
Platelets: 235 10*3/uL (ref 150–450)
RBC: 4.34 x10E6/uL (ref 3.77–5.28)
RDW: 13.6 % (ref 11.7–15.4)
WBC: 6.6 10*3/uL (ref 3.4–10.8)

## 2023-08-20 LAB — BASIC METABOLIC PANEL WITH GFR
BUN/Creatinine Ratio: 17 (ref 12–28)
BUN: 13 mg/dL (ref 8–27)
CO2: 19 mmol/L — ABNORMAL LOW (ref 20–29)
Calcium: 9.2 mg/dL (ref 8.7–10.3)
Chloride: 104 mmol/L (ref 96–106)
Creatinine, Ser: 0.75 mg/dL (ref 0.57–1.00)
Glucose: 90 mg/dL (ref 70–99)
Potassium: 4.1 mmol/L (ref 3.5–5.2)
Sodium: 141 mmol/L (ref 134–144)
eGFR: 77 mL/min/{1.73_m2} (ref >59)

## 2023-08-20 LAB — MAGNESIUM: Magnesium: 1.6 mg/dL (ref 1.6–2.3)

## 2023-08-20 MED ORDER — MAGNESIUM OXIDE 400 MG PO TABS
400.0000 mg | ORAL_TABLET | Freq: Every day | ORAL | Status: DC
Start: 2023-08-20 — End: 2023-09-07

## 2023-08-24 LAB — CUP PACEART REMOTE DEVICE CHECK
Battery Remaining Longevity: 24 mo
Battery Remaining Percentage: 41 %
Brady Statistic RA Percent Paced: 84 %
Brady Statistic RV Percent Paced: 1 %
Date Time Interrogation Session: 20250626015100
Implantable Lead Connection Status: 753985
Implantable Lead Connection Status: 753985
Implantable Lead Implant Date: 20180913
Implantable Lead Implant Date: 20180913
Implantable Lead Location: 753859
Implantable Lead Location: 753860
Implantable Lead Model: 4473
Implantable Lead Model: 7742
Implantable Lead Serial Number: 497240
Implantable Lead Serial Number: 841204
Implantable Pulse Generator Implant Date: 20180913
Lead Channel Impedance Value: 372 Ohm
Lead Channel Impedance Value: 926 Ohm
Lead Channel Pacing Threshold Amplitude: 0.4 V
Lead Channel Pacing Threshold Amplitude: 1 V
Lead Channel Pacing Threshold Pulse Width: 0.4 ms
Lead Channel Pacing Threshold Pulse Width: 0.4 ms
Lead Channel Setting Pacing Amplitude: 2 V
Lead Channel Setting Pacing Amplitude: 2 V
Lead Channel Setting Pacing Pulse Width: 0.4 ms
Lead Channel Setting Sensing Sensitivity: 2.5 mV
Pulse Gen Serial Number: 374129
Zone Setting Status: 755011

## 2023-08-25 DIAGNOSIS — M545 Low back pain, unspecified: Secondary | ICD-10-CM | POA: Diagnosis not present

## 2023-08-26 ENCOUNTER — Ambulatory Visit: Payer: Self-pay | Admitting: Cardiology

## 2023-09-03 DIAGNOSIS — M545 Low back pain, unspecified: Secondary | ICD-10-CM | POA: Diagnosis not present

## 2023-09-03 DIAGNOSIS — M5416 Radiculopathy, lumbar region: Secondary | ICD-10-CM | POA: Diagnosis not present

## 2023-09-03 DIAGNOSIS — Z79899 Other long term (current) drug therapy: Secondary | ICD-10-CM | POA: Diagnosis not present

## 2023-09-04 ENCOUNTER — Other Ambulatory Visit: Payer: Self-pay | Admitting: Cardiovascular Disease

## 2023-09-04 ENCOUNTER — Telehealth: Payer: Self-pay | Admitting: Internal Medicine

## 2023-09-04 DIAGNOSIS — Z7902 Long term (current) use of antithrombotics/antiplatelets: Secondary | ICD-10-CM | POA: Diagnosis not present

## 2023-09-04 DIAGNOSIS — Z7982 Long term (current) use of aspirin: Secondary | ICD-10-CM | POA: Diagnosis not present

## 2023-09-04 DIAGNOSIS — M810 Age-related osteoporosis without current pathological fracture: Secondary | ICD-10-CM | POA: Diagnosis not present

## 2023-09-04 DIAGNOSIS — E669 Obesity, unspecified: Secondary | ICD-10-CM | POA: Diagnosis not present

## 2023-09-04 DIAGNOSIS — Z6834 Body mass index (BMI) 34.0-34.9, adult: Secondary | ICD-10-CM | POA: Diagnosis not present

## 2023-09-04 DIAGNOSIS — I10 Essential (primary) hypertension: Secondary | ICD-10-CM | POA: Diagnosis not present

## 2023-09-04 DIAGNOSIS — F419 Anxiety disorder, unspecified: Secondary | ICD-10-CM | POA: Diagnosis not present

## 2023-09-04 DIAGNOSIS — Z7983 Long term (current) use of bisphosphonates: Secondary | ICD-10-CM | POA: Diagnosis not present

## 2023-09-04 NOTE — Telephone Encounter (Signed)
 Called and spoke with patient. Notified her of the following from Dr. Gollan.  I am a little bit out of the loop as I have not seen her since January 2024 Appears Lasix  may have been started June 25 by Suzann Michelle, can we find out how many Lasix  she has been taking and maybe recommend she hold it for now Thx TGollan  Patient states that she has been taking the Lasix  1 -2 times weekly. Recommended to patient that she hold Lasix  at this time. Patient states that she is not at home and able to check a current blood pressure. Patient denies any symptoms. Patient encouraged to check blood pressures and send a log into the office. Patient verbalizes understanding.

## 2023-09-04 NOTE — Telephone Encounter (Signed)
 Copied from CRM [id number]. Topic: General - Other >> Sep 04, 2023  1:16 PM Sandra Murphy wrote: Reason for CRM: [place] from [facility] stated that the patient blood pressure standing 89/55  and sitting 77/50. Patient is aslo asymptomatic  Callback:[phone]

## 2023-09-07 ENCOUNTER — Other Ambulatory Visit: Payer: Self-pay | Admitting: Internal Medicine

## 2023-09-07 ENCOUNTER — Encounter: Payer: Self-pay | Admitting: Internal Medicine

## 2023-09-07 ENCOUNTER — Ambulatory Visit (INDEPENDENT_AMBULATORY_CARE_PROVIDER_SITE_OTHER): Admitting: Internal Medicine

## 2023-09-07 VITALS — BP 120/70 | HR 70 | Ht 60.0 in | Wt 170.0 lb

## 2023-09-07 DIAGNOSIS — K5903 Drug induced constipation: Secondary | ICD-10-CM

## 2023-09-07 DIAGNOSIS — R300 Dysuria: Secondary | ICD-10-CM

## 2023-09-07 DIAGNOSIS — F411 Generalized anxiety disorder: Secondary | ICD-10-CM | POA: Diagnosis not present

## 2023-09-07 LAB — POCT URINALYSIS DIPSTICK
Bilirubin, UA: NEGATIVE
Blood, UA: NEGATIVE
Glucose, UA: NEGATIVE
Ketones, UA: NEGATIVE
Nitrite, UA: NEGATIVE
Protein, UA: NEGATIVE
Spec Grav, UA: 1.015 (ref 1.010–1.025)
Urobilinogen, UA: 0.2 U/dL
pH, UA: 5 (ref 5.0–8.0)

## 2023-09-07 MED ORDER — NITROFURANTOIN MONOHYD MACRO 100 MG PO CAPS: 100.0000 mg | ORAL_CAPSULE | Freq: Two times a day (BID) | ORAL | 0 refills | Status: DC

## 2023-09-07 MED ORDER — LORAZEPAM 0.5 MG PO TABS: 0.5000 mg | ORAL_TABLET | Freq: Two times a day (BID) | ORAL | 3 refills | Status: DC | PRN

## 2023-09-07 NOTE — Assessment & Plan Note (Signed)
 Stable mood and sleep with Ativan  and Remeron . No evidence of misuse or abuse.

## 2023-09-07 NOTE — Progress Notes (Signed)
 Date:  09/07/2023   Name:  Sandra Murphy   DOB:  26-May-1933   MRN:  [id number]   Chief Complaint: Nausea (Nausea with constipation. Patient is now taking tramadol for back pain and its causing constipation. Pt said it is painful to have a BM.)  Constipation This is a new problem. The current episode started in the past 7 days. Her stool frequency is 2 to 3 times per week. The stool is described as pellet like. The patient is not on a high fiber diet. She Does not exercise regularly. There has Been adequate water intake. Associated symptoms include abdominal pain and back pain. Pertinent negatives include no difficulty urinating, fever, nausea or vomiting.  Dysuria  This is a new problem. The current episode started in the past 7 days. The problem occurs every urination. The problem has been unchanged. The quality of the pain is described as burning. The pain is mild. There has been no fever. Associated symptoms include frequency. Pertinent negatives include no chills, flank pain, hematuria, nausea, urgency or vomiting. She has tried nothing for the symptoms.    Review of Systems  Constitutional:  Negative for chills, fatigue and fever.  Respiratory:  Negative for chest tightness and shortness of breath.   Gastrointestinal:  Positive for abdominal pain and constipation. Negative for nausea and vomiting.  Genitourinary:  Positive for dysuria and frequency. Negative for difficulty urinating, flank pain, hematuria and urgency.  Musculoskeletal:  Positive for back pain.  Psychiatric/Behavioral:  Positive for sleep disturbance. Negative for dysphoric mood. The patient is nervous/anxious.      Lab Results  Component Value Date   NA 141 08/19/2023   K 4.1 08/19/2023   CO2 19 (L) 08/19/2023   GLUCOSE 90 08/19/2023   BUN 13 08/19/2023   CREATININE 0.75 08/19/2023   CALCIUM 9.2 08/19/2023   EGFR 77 08/19/2023   GFRNONAA 58 (L) 07/27/2022   Lab Results  Component Value Date   CHOL 173  10/08/2022   HDL 52 10/08/2022   LDLCALC 85 10/08/2022   TRIG 219 (H) 10/08/2022   CHOLHDL 3.3 10/08/2022   Lab Results  Component Value Date   TSH 1.370 10/08/2022   No results found for: HGBA1C Lab Results  Component Value Date   WBC 6.6 08/19/2023   HGB 13.2 08/19/2023   HCT 41.0 08/19/2023   MCV 95 08/19/2023   PLT 235 08/19/2023   Lab Results  Component Value Date   ALT 12 10/08/2022   AST 17 10/08/2022   ALKPHOS 85 10/08/2022   BILITOT 0.4 10/08/2022   No results found for: MARIEN BOLLS, VD25OH   Patient Active Problem List   Diagnosis Date Noted   Essential hypertension 10/08/2022   Tachy-brady syndrome (HCC) 12/04/2021   GERD without esophagitis 12/04/2021   Generalized anxiety disorder 12/04/2021   Degenerative disc disease, lumbar 12/04/2021   OAB (overactive bladder) 12/04/2021   Acquired hypothyroidism 12/04/2021   Mixed hyperlipidemia 08/08/2021   Hx of deep venous thrombosis 08/08/2021   Osteoporosis 08/08/2021   PVD (peripheral vascular disease) (HCC) 08/08/2021   Hx of heart artery stent 08/08/2021   Hiatal hernia 08/08/2021   Asthma 11/14/2020   Atherosclerotic heart disease of native coronary artery without angina pectoris 01/10/2016   PVCs (premature ventricular contractions) 01/10/2016    Allergies  Allergen Reactions   Penicillins Hives   Other Other (See Comments)   Sulfa Antibiotics Nausea Only, Other (See Comments) and Diarrhea    HEADACHE  Past Surgical History:  Procedure Laterality Date   ABDOMINAL HYSTERECTOMY     ANTERIOR FUSION CERVICAL SPINE     twice   CHOLECYSTECTOMY     CORONARY ANGIOPLASTY WITH STENT PLACEMENT     proximal LAD and mid RCA   LUMBAR DISC SURGERY     Pace maker  11/06/2016   Medtronic Boston Scietific Pacemaker Model L311/ Serial [id number]   THYROIDECTOMY Left     Social History   Tobacco Use   Smoking status: Never   Smokeless tobacco: Never  Vaping Use   Vaping status: Never  Used  Substance Use Topics   Alcohol use: No   Drug use: Never     Medication list has been reviewed and updated.  Current Meds  Medication Sig   Acetaminophen (TYLENOL PO) Take by mouth as needed.   alendronate  (FOSAMAX ) 70 MG tablet Take 1 tablet by mouth once a week   Aspirin 81 MG CAPS Take 1 tablet by mouth daily.   Calcium 200 MG TABS Take 600 mg by mouth 2 (two) times daily.   Cholecalciferol (VITAMIN D3) 250 MCG (10000 UT) CAPS Take by mouth.   clopidogrel  (PLAVIX ) 75 MG tablet Take 1 tablet by mouth once daily   levothyroxine  (SYNTHROID ) 50 MCG tablet TAKE 1 TABLET BY MOUTH ONCE DAILY BEFORE BREAKFAST   mirtazapine  (REMERON ) 30 MG tablet Take 1 tablet (30 mg total) by mouth daily.   NIFEdipine  (PROCARDIA -XL/NIFEDICAL-XL) 30 MG 24 hr tablet TAKE ONE TABLET (30 MG) BY MOUTH ONCE DAILY.   nitrofurantoin , macrocrystal-monohydrate, (MACROBID ) 100 MG capsule Take 1 capsule (100 mg total) by mouth 2 (two) times daily for 7 days.   nitroGLYCERIN (NITROSTAT) 0.4 MG SL tablet Place under the tongue.   oxybutynin  (DITROPAN -XL) 10 MG 24 hr tablet Take 1 tablet by mouth once daily   pantoprazole  (PROTONIX ) 40 MG tablet Take 1 tablet (40 mg total) by mouth 2 (two) times daily.   pravastatin  (PRAVACHOL ) 40 MG tablet Take 1 tablet by mouth once daily   sacubitril-valsartan (ENTRESTO ) 24-26 MG Take 1 tablet by mouth twice daily   SOTALOL  AF 80 MG TABS Take 1 tablet by mouth twice daily   traMADol (ULTRAM) 50 MG tablet Take 50 mg by mouth every 6 (six) hours as needed.   [DISCONTINUED] LORazepam  (ATIVAN ) 0.5 MG tablet Take 1 tablet by mouth twice daily as needed for anxiety       09/07/2023   10:12 AM 04/09/2023   10:04 AM 10/08/2022   10:02 AM 03/26/2022   10:28 AM  GAD 7 : Generalized Anxiety Score  Nervous, Anxious, on Edge 0 0 0 1  Control/stop worrying 0 0 0 0  Worry too much - different things 0 0 0 0  Trouble relaxing 0 0 0 0  Restless 0 0 0 0  Easily annoyed or irritable 0 0 0 0   Afraid - awful might happen 0 0 0 0  Total GAD 7 Score 0 0 0 1  Anxiety Difficulty Not difficult at all Not difficult at all Not difficult at all Not difficult at all       09/07/2023   10:11 AM 04/09/2023   10:04 AM 01/14/2023   10:23 AM  Depression screen PHQ 2/9  Decreased Interest 0 0 0  Down, Depressed, Hopeless 0 0 0  PHQ - 2 Score 0 0 0  Altered sleeping 1  0  Tired, decreased energy 1  0  Change in appetite 2  0  Feeling  bad or failure about yourself  0  0  Trouble concentrating 0  0  Moving slowly or fidgety/restless 0  0  Suicidal thoughts 0  0  PHQ-9 Score 4  0  Difficult doing work/chores Not difficult at all  Not difficult at all    BP Readings from Last 3 Encounters:  09/07/23 120/70  08/19/23 132/72  04/09/23 112/76    Physical Exam Vitals and nursing note reviewed.  Constitutional:      General: She is not in acute distress.    Appearance: Normal appearance. She is well-developed.  HENT:     Head: Normocephalic and atraumatic.  Cardiovascular:     Rate and Rhythm: Normal rate and regular rhythm.  Pulmonary:     Effort: Pulmonary effort is normal. No respiratory distress.     Breath sounds: No wheezing or rhonchi.  Abdominal:     Tenderness: There is abdominal tenderness. There is no right CVA tenderness, left CVA tenderness, guarding or rebound.  Musculoskeletal:     Cervical back: Normal range of motion.     Right lower leg: No edema.     Left lower leg: No edema.  Lymphadenopathy:     Cervical: No cervical adenopathy.  Skin:    General: Skin is warm and dry.     Findings: No rash.  Neurological:     General: No focal deficit present.     Mental Status: She is alert and oriented to person, place, and time.  Psychiatric:        Mood and Affect: Mood normal.        Behavior: Behavior normal.    Lab Results  Component Value Date   COLORU yellow 09/07/2023   CLARITYU clear 09/07/2023   GLUCOSEUR Negative 09/07/2023   BILIRUBINUR neg  09/07/2023   KETONESU neg 09/07/2023   SPECGRAV 1.015 09/07/2023   RBCUR neg 09/07/2023   PHUR 5.0 09/07/2023   PROTEINUR Negative 09/07/2023   UROBILINOGEN 0.2 09/07/2023   LEUKOCYTESUR Large (3+) (A) 09/07/2023     Wt Readings from Last 3 Encounters:  09/07/23 170 lb (77.1 kg)  08/19/23 175 lb 6.4 oz (79.6 kg)  04/09/23 175 lb (79.4 kg)    BP 120/70   Pulse 70   Ht 5' (1.524 m)   Wt 170 lb (77.1 kg)   SpO2 92%   BMI 33.20 kg/m   Assessment and Plan:  Problem List Items Addressed This Visit       Unprioritized   Generalized anxiety disorder (Chronic)   Stable mood and sleep with Ativan  and Remeron . No evidence of misuse or abuse.      Relevant Medications   LORazepam  (ATIVAN ) 0.5 MG tablet   Other Visit Diagnoses       Dysuria    -  Primary   UA + for leuks will get culture treat presumptively with Macrobid  due to PCN and Sulfa allergies Push fluids   Relevant Medications   nitrofurantoin , macrocrystal-monohydrate, (MACROBID ) 100 MG capsule   Other Relevant Orders   Urine Culture   POCT urinalysis dipstick     Drug-induced constipation       since starting Tramadol for back pain continue to push fluids, high fiber foods, use Dulcolax PRN       No follow-ups on file.    [name], MD [facility] [place]

## 2023-09-09 ENCOUNTER — Encounter: Payer: Self-pay | Admitting: Internal Medicine

## 2023-09-09 DIAGNOSIS — Z880 Allergy status to penicillin: Secondary | ICD-10-CM | POA: Diagnosis not present

## 2023-09-09 DIAGNOSIS — K449 Diaphragmatic hernia without obstruction or gangrene: Secondary | ICD-10-CM | POA: Diagnosis not present

## 2023-09-09 DIAGNOSIS — F419 Anxiety disorder, unspecified: Secondary | ICD-10-CM | POA: Diagnosis not present

## 2023-09-09 DIAGNOSIS — E785 Hyperlipidemia, unspecified: Secondary | ICD-10-CM | POA: Diagnosis not present

## 2023-09-09 DIAGNOSIS — E039 Hypothyroidism, unspecified: Secondary | ICD-10-CM | POA: Diagnosis not present

## 2023-09-09 DIAGNOSIS — I10 Essential (primary) hypertension: Secondary | ICD-10-CM | POA: Diagnosis not present

## 2023-09-09 DIAGNOSIS — I2510 Atherosclerotic heart disease of native coronary artery without angina pectoris: Secondary | ICD-10-CM | POA: Diagnosis not present

## 2023-09-09 DIAGNOSIS — R079 Chest pain, unspecified: Secondary | ICD-10-CM | POA: Diagnosis not present

## 2023-09-09 DIAGNOSIS — J45909 Unspecified asthma, uncomplicated: Secondary | ICD-10-CM | POA: Diagnosis not present

## 2023-09-09 DIAGNOSIS — J9811 Atelectasis: Secondary | ICD-10-CM | POA: Diagnosis not present

## 2023-09-09 DIAGNOSIS — R531 Weakness: Secondary | ICD-10-CM | POA: Diagnosis not present

## 2023-09-10 ENCOUNTER — Ambulatory Visit: Payer: Self-pay | Admitting: Internal Medicine

## 2023-09-10 ENCOUNTER — Telehealth: Payer: Self-pay | Admitting: Internal Medicine

## 2023-09-10 DIAGNOSIS — R531 Weakness: Secondary | ICD-10-CM | POA: Diagnosis not present

## 2023-09-10 DIAGNOSIS — N3000 Acute cystitis without hematuria: Secondary | ICD-10-CM

## 2023-09-10 LAB — URINE CULTURE

## 2023-09-10 MED ORDER — CIPROFLOXACIN HCL 250 MG PO TABS: 250.0000 mg | ORAL_TABLET | Freq: Two times a day (BID) | ORAL | 0 refills | Status: DC

## 2023-09-10 NOTE — Telephone Encounter (Signed)
 FYI Only or Action Required?: Action required by provider: interaction between sotalol  and cipro  causing increase to QT interval.  Patient was last seen in primary care on 09/07/2023 by Justus Leita DEL, MD.  Called Nurse Triage reporting No chief complaint on file..  Symptoms began today.  Interventions attempted: Nothing.  Symptoms are: stable.  Triage Disposition: No disposition on file.  Patient/caregiver understands and will follow disposition?:        Copied from CRM [id number]. Topic: Clinical - Prescription Issue >> Sep 10, 2023  2:49 PM Selinda RAMAN wrote: Reason for CRM: Clarita the pharmacist with [facility] called in wanting to speak with a nurse about a drug interaction between ciprofloxacin  (CIPRO ) 250 MG tablet and SOTALOL  AF 80 MG TABS. I will transfer her to [facility]

## 2023-09-11 ENCOUNTER — Other Ambulatory Visit: Payer: Self-pay | Admitting: Internal Medicine

## 2023-09-11 DIAGNOSIS — N3000 Acute cystitis without hematuria: Secondary | ICD-10-CM

## 2023-09-11 MED ORDER — DOXYCYCLINE HYCLATE 100 MG PO TABS
100.0000 mg | ORAL_TABLET | Freq: Two times a day (BID) | ORAL | 0 refills | Status: AC
Start: 2023-09-11 — End: 2023-09-16

## 2023-09-11 NOTE — Telephone Encounter (Signed)
 Spoke with Clarita from [facility] pharmacy and notified her of the alternative medication (doxycycline (VIBRA-TABS) 100 MG tablet) sent in for patient.

## 2023-09-11 NOTE — Progress Notes (Unsigned)
 Date:  09/11/2023   Name:  Sandra Murphy   DOB:  [birth year]   MRN:  [id number]   Chief Complaint: No chief complaint on file.  HPI  Review of Systems   Lab Results  Component Value Date   NA 141 08/19/2023   K 4.1 08/19/2023   CO2 19 (L) 08/19/2023   GLUCOSE 90 08/19/2023   BUN 13 08/19/2023   CREATININE 0.75 08/19/2023   CALCIUM 9.2 08/19/2023   EGFR 77 08/19/2023   GFRNONAA 58 (L) 07/27/2022   Lab Results  Component Value Date   CHOL 173 10/08/2022   HDL 52 10/08/2022   LDLCALC 85 10/08/2022   TRIG 219 (H) 10/08/2022   CHOLHDL 3.3 10/08/2022   Lab Results  Component Value Date   TSH 1.370 10/08/2022   No results found for: HGBA1C Lab Results  Component Value Date   WBC 6.6 08/19/2023   HGB 13.2 08/19/2023   HCT 41.0 08/19/2023   MCV 95 08/19/2023   PLT 235 08/19/2023   Lab Results  Component Value Date   ALT 12 10/08/2022   AST 17 10/08/2022   ALKPHOS 85 10/08/2022   BILITOT 0.4 10/08/2022   No results found for: MARIEN BOLLS, VD25OH   Patient Active Problem List   Diagnosis Date Noted   Essential hypertension 10/08/2022   Tachy-brady syndrome (HCC) 12/04/2021   GERD without esophagitis 12/04/2021   Generalized anxiety disorder 12/04/2021   Degenerative disc disease, lumbar 12/04/2021   OAB (overactive bladder) 12/04/2021   Acquired hypothyroidism 12/04/2021   Mixed hyperlipidemia 08/08/2021   Hx of deep venous thrombosis 08/08/2021   Osteoporosis 08/08/2021   PVD (peripheral vascular disease) (HCC) 08/08/2021   Hx of heart artery stent 08/08/2021   Hiatal hernia 08/08/2021   Asthma 11/14/2020   Atherosclerotic heart disease of native coronary artery without angina pectoris 01/10/2016   PVCs (premature ventricular contractions) 01/10/2016    Allergies  Allergen Reactions   Penicillins Hives   Other Other (See Comments)   Sulfa Antibiotics Nausea Only, Other (See Comments) and Diarrhea    HEADACHE     Past Surgical  History:  Procedure Laterality Date   ABDOMINAL HYSTERECTOMY     ANTERIOR FUSION CERVICAL SPINE     twice   CHOLECYSTECTOMY     CORONARY ANGIOPLASTY WITH STENT PLACEMENT     proximal LAD and mid RCA   LUMBAR DISC SURGERY     Pace maker  11/06/2016   Medtronic Boston Scietific Pacemaker Model L311/ Serial [id number]   THYROIDECTOMY Left     Social History   Tobacco Use   Smoking status: Never   Smokeless tobacco: Never  Vaping Use   Vaping status: Never Used  Substance Use Topics   Alcohol use: No   Drug use: Never     Medication list has been reviewed and updated.  No outpatient medications have been marked as taking for the 09/11/23 encounter (Orders Only) with Justus Leita DEL, MD.       09/07/2023   10:12 AM 04/09/2023   10:04 AM 10/08/2022   10:02 AM 03/26/2022   10:28 AM  GAD 7 : Generalized Anxiety Score  Nervous, Anxious, on Edge 0 0 0 1  Control/stop worrying 0 0 0 0  Worry too much - different things 0 0 0 0  Trouble relaxing 0 0 0 0  Restless 0 0 0 0  Easily annoyed or irritable 0 0 0 0  Afraid - awful might happen 0  0 0 0  Total GAD 7 Score 0 0 0 1  Anxiety Difficulty Not difficult at all Not difficult at all Not difficult at all Not difficult at all       09/07/2023   10:11 AM 04/09/2023   10:04 AM 01/14/2023   10:23 AM  Depression screen PHQ 2/9  Decreased Interest 0 0 0  Down, Depressed, Hopeless 0 0 0  PHQ - 2 Score 0 0 0  Altered sleeping 1  0  Tired, decreased energy 1  0  Change in appetite 2  0  Feeling bad or failure about yourself  0  0  Trouble concentrating 0  0  Moving slowly or fidgety/restless 0  0  Suicidal thoughts 0  0  PHQ-9 Score 4  0  Difficult doing work/chores Not difficult at all  Not difficult at all    BP Readings from Last 3 Encounters:  09/07/23 120/70  08/19/23 132/72  04/09/23 112/76    Physical Exam  Wt Readings from Last 3 Encounters:  09/07/23 170 lb (77.1 kg)  08/19/23 175 lb 6.4 oz (79.6 kg)  04/09/23  175 lb (79.4 kg)    There were no vitals taken for this visit.  Assessment and Plan:  Problem List Items Addressed This Visit   None   No follow-ups on file.    [name], MD [facility] [place]

## 2023-09-13 ENCOUNTER — Other Ambulatory Visit: Payer: Self-pay | Admitting: Internal Medicine

## 2023-09-15 NOTE — Telephone Encounter (Signed)
 Requested Prescriptions  Pending Prescriptions Disp Refills   levothyroxine  (SYNTHROID ) 50 MCG tablet [Pharmacy Med Name: Levothyroxine  Sodium 50 MCG Oral Tablet] 90 tablet 0    Sig: TAKE 1 TABLET BY MOUTH ONCE DAILY BEFORE BREAKFAST     Endocrinology:  Hypothyroid Agents Passed - 09/15/2023  2:50 PM      Passed - TSH in normal range and within 360 days    TSH  Date Value Ref Range Status  10/08/2022 1.370 0.450 - 4.500 uIU/mL Final         Passed - Valid encounter within last 12 months    Recent Outpatient Visits           1 week ago Dysuria   [facility] at [facility], Leita DEL, MD   5 months ago Generalized anxiety disorder   [facility] at [facility], Leita DEL, MD       Future Appointments             In 3 weeks Justus Leita DEL, MD [facility] at [facility], [facility]

## 2023-09-16 DIAGNOSIS — Z7902 Long term (current) use of antithrombotics/antiplatelets: Secondary | ICD-10-CM | POA: Diagnosis not present

## 2023-09-16 DIAGNOSIS — M810 Age-related osteoporosis without current pathological fracture: Secondary | ICD-10-CM | POA: Diagnosis not present

## 2023-09-16 DIAGNOSIS — I10 Essential (primary) hypertension: Secondary | ICD-10-CM | POA: Diagnosis not present

## 2023-09-16 DIAGNOSIS — E669 Obesity, unspecified: Secondary | ICD-10-CM | POA: Diagnosis not present

## 2023-09-16 DIAGNOSIS — Z6834 Body mass index (BMI) 34.0-34.9, adult: Secondary | ICD-10-CM | POA: Diagnosis not present

## 2023-09-16 DIAGNOSIS — F419 Anxiety disorder, unspecified: Secondary | ICD-10-CM | POA: Diagnosis not present

## 2023-09-16 DIAGNOSIS — Z7983 Long term (current) use of bisphosphonates: Secondary | ICD-10-CM | POA: Diagnosis not present

## 2023-09-16 DIAGNOSIS — Z7982 Long term (current) use of aspirin: Secondary | ICD-10-CM | POA: Diagnosis not present

## 2023-09-21 ENCOUNTER — Other Ambulatory Visit: Payer: Self-pay | Admitting: Internal Medicine

## 2023-09-21 DIAGNOSIS — M810 Age-related osteoporosis without current pathological fracture: Secondary | ICD-10-CM

## 2023-09-22 DIAGNOSIS — M810 Age-related osteoporosis without current pathological fracture: Secondary | ICD-10-CM | POA: Diagnosis not present

## 2023-09-22 DIAGNOSIS — E669 Obesity, unspecified: Secondary | ICD-10-CM | POA: Diagnosis not present

## 2023-09-22 DIAGNOSIS — Z7983 Long term (current) use of bisphosphonates: Secondary | ICD-10-CM | POA: Diagnosis not present

## 2023-09-22 DIAGNOSIS — I10 Essential (primary) hypertension: Secondary | ICD-10-CM | POA: Diagnosis not present

## 2023-09-22 DIAGNOSIS — Z7902 Long term (current) use of antithrombotics/antiplatelets: Secondary | ICD-10-CM | POA: Diagnosis not present

## 2023-09-22 DIAGNOSIS — Z6834 Body mass index (BMI) 34.0-34.9, adult: Secondary | ICD-10-CM | POA: Diagnosis not present

## 2023-09-22 DIAGNOSIS — F419 Anxiety disorder, unspecified: Secondary | ICD-10-CM | POA: Diagnosis not present

## 2023-09-22 DIAGNOSIS — Z7982 Long term (current) use of aspirin: Secondary | ICD-10-CM | POA: Diagnosis not present

## 2023-09-22 DIAGNOSIS — M5116 Intervertebral disc disorders with radiculopathy, lumbar region: Secondary | ICD-10-CM | POA: Diagnosis not present

## 2023-09-25 ENCOUNTER — Other Ambulatory Visit: Payer: Self-pay

## 2023-09-25 ENCOUNTER — Emergency Department
Admission: EM | Admit: 2023-09-25 | Discharge: 2023-09-25 | Disposition: A | Discharge status: Home / Self Care | Attending: Emergency Medicine | Admitting: Emergency Medicine

## 2023-09-25 ENCOUNTER — Emergency Department

## 2023-09-25 ENCOUNTER — Telehealth: Payer: Self-pay | Admitting: Emergency Medicine

## 2023-09-25 DIAGNOSIS — K449 Diaphragmatic hernia without obstruction or gangrene: Secondary | ICD-10-CM | POA: Diagnosis not present

## 2023-09-25 DIAGNOSIS — I10 Essential (primary) hypertension: Secondary | ICD-10-CM | POA: Diagnosis not present

## 2023-09-25 DIAGNOSIS — R079 Chest pain, unspecified: Secondary | ICD-10-CM

## 2023-09-25 DIAGNOSIS — I2510 Atherosclerotic heart disease of native coronary artery without angina pectoris: Secondary | ICD-10-CM | POA: Diagnosis not present

## 2023-09-25 DIAGNOSIS — Z7901 Long term (current) use of anticoagulants: Secondary | ICD-10-CM | POA: Insufficient documentation

## 2023-09-25 DIAGNOSIS — R0789 Other chest pain: Secondary | ICD-10-CM | POA: Diagnosis not present

## 2023-09-25 DIAGNOSIS — E039 Hypothyroidism, unspecified: Secondary | ICD-10-CM | POA: Insufficient documentation

## 2023-09-25 DIAGNOSIS — I517 Cardiomegaly: Secondary | ICD-10-CM | POA: Diagnosis not present

## 2023-09-25 DIAGNOSIS — R918 Other nonspecific abnormal finding of lung field: Secondary | ICD-10-CM | POA: Diagnosis not present

## 2023-09-25 LAB — BASIC METABOLIC PANEL WITH GFR
Anion gap: 12 (ref 5–15)
BUN: 11 mg/dL (ref 8–23)
CO2: 24 mmol/L (ref 22–32)
Calcium: 9 mg/dL (ref 8.9–10.3)
Chloride: 104 mmol/L (ref 98–111)
Creatinine, Ser: 0.84 mg/dL (ref 0.44–1.00)
GFR, Estimated: >60 mL/min (ref >60)
Glucose, Bld: 108 mg/dL — ABNORMAL HIGH (ref 70–99)
Potassium: 3.9 mmol/L (ref 3.5–5.1)
Sodium: 140 mmol/L (ref 135–145)

## 2023-09-25 LAB — TROPONIN I (HIGH SENSITIVITY)
Troponin I (High Sensitivity): 9 ng/L (ref <18)
Troponin I (High Sensitivity): 8 ng/L (ref <18)

## 2023-09-25 LAB — CBC
HCT: 40.3 % (ref 36.0–46.0)
Hemoglobin: 13.3 g/dL (ref 12.0–15.0)
MCH: 30 pg (ref 26.0–34.0)
MCHC: 33 g/dL (ref 30.0–36.0)
MCV: 91 fL (ref 80.0–100.0)
Platelets: 230 K/uL (ref 150–400)
RBC: 4.43 MIL/uL (ref 3.87–5.11)
RDW: 13 % (ref 11.5–15.5)
WBC: 6 K/uL (ref 4.0–10.5)
nRBC: 0 % (ref 0.0–0.2)

## 2023-09-25 NOTE — ED Provider Notes (Signed)
 [facility] Provider Note    Event Date/Time   First MD Initiated Contact with Patient 09/25/23 1947     (approximate)   History   Chief Complaint: Chest Pain   HPI  Sandra Murphy is a 89 y.o. female with a history of GERD, hypertension, atrial fibrillation on Eliquis who comes ED complaining of central chest heaviness that started this morning at about 11:00, lasted for an hour and then resolved.  Nonradiating, Sandra diaphoresis vomiting or shortness of breath.  Sandra exertional symptoms.        Past Medical History:  Diagnosis Date   CAD (coronary artery disease)    stents to proximal LAD and mid RCA   Hyperlipidemia    Hypertension    Hypothyroidism    IBS (irritable bowel syndrome)    Osteoarthritis    PAF (paroxysmal atrial fibrillation) (HCC)    a. Subclinical - detected on device monitoring-->sotalol .  Sandra OAC given low-burden.   PVD (peripheral vascular disease) (HCC)    SVT (supraventricular tachycardia) (HCC)    Tachy-brady syndrome (HCC)    a. 10/2016 s/p BSX L311 Accolade MRI DC PPM.    Current Outpatient Rx   Order #: [id number] Class: Historical Med   Order #: [id number] Class: Normal   Order #: [id number] Class: Historical Med   Order #: [id number] Class: Historical Med   Order #: [id number] Class: Historical Med   Order #: [id number] Class: Normal   Order #: [id number] Class: Normal   Order #: [id number] Class: Normal   Order #: [id number] Class: Normal   Order #: [id number] Class: Normal   Order #: [id number] Class: Normal   Order #: [id number] Class: Normal   Order #: [id number] Class: Historical Med   Order #: [id number] Class: Normal   Order #: [id number] Class: Normal   Order #: [id number] Class: Normal   Order #: [id number] Class: Normal   Order #: [id number] Class: Normal   Order #: [id number] Class: Historical Med    Past Surgical History:  Procedure Laterality Date   ABDOMINAL HYSTERECTOMY     ANTERIOR FUSION CERVICAL SPINE     twice    CHOLECYSTECTOMY     CORONARY ANGIOPLASTY WITH STENT PLACEMENT     proximal LAD and mid RCA   LUMBAR DISC SURGERY     Pace maker  11/06/2016   Medtronic Boston Scietific Pacemaker Model L311/ Serial [id number]   THYROIDECTOMY Left     Physical Exam   Triage Vital Signs: ED Triage Vitals  Encounter Vitals Group     BP 09/25/23 1421 137/68     Girls Systolic BP Percentile --      Girls Diastolic BP Percentile --      Boys Systolic BP Percentile --      Boys Diastolic BP Percentile --      Pulse Rate 09/25/23 1421 72     Resp 09/25/23 1421 16     Temp 09/25/23 1421 98.3 F (36.8 C)     Temp Source 09/25/23 1421 Axillary     SpO2 09/25/23 1421 98 %     Weight 09/25/23 1420 170 lb (77.1 kg)     Height 09/25/23 1420 5' (1.524 m)     Head Circumference --      Peak Flow --      Pain Score 09/25/23 1417 10     Pain Loc --      Pain Education --      Exclude from Growth Chart --     Most recent vital signs: Vitals:   09/25/23 2028 09/25/23 2100  BP: (!) 154/86 102/88  Pulse: 78 71  Resp: 16   Temp: 98 F (36.7 C)   SpO2: 100% 91%    General: Awake, Sandra distress.  CV:  Good peripheral perfusion.  Regular rate rhythm Resp:  Normal effort.  Clear lungs Abd:  Sandra distention.  Soft nontender Other:  Sandra lower extremity edema   ED Results / Procedures / Treatments   Labs (all labs ordered are listed, but only abnormal results are displayed) Labs Reviewed  BASIC METABOLIC PANEL WITH GFR - Abnormal; Notable for the following components:      Result Value   Glucose, Bld 108 (*)    All other components within normal limits  CBC  TROPONIN I (HIGH SENSITIVITY)  TROPONIN I (HIGH SENSITIVITY)     EKG Interpreted by me Sinus rhythm rate of 71.  Atrial paced.  First-degree AV block.   RADIOLOGY Chest x-ray interpreted by me, unremarkable.  Radiology report reviewed   PROCEDURES:  Procedures   MEDICATIONS ORDERED IN ED: Medications - Sandra data to display   IMPRESSION  / MDM / ASSESSMENT AND PLAN / ED COURSE  I reviewed the triage vital signs and the nursing notes.  DDx: GERD, fatigue, non-STEMI, pneumonia.  Doubt pneumothorax dissection or PE  Patient's presentation is most consistent with acute presentation with potential threat to life or bodily function.  Presents with nonspecific chest discomfort which lasted for about an hour and resolved.  Sandra recurrent discomfort or other symptoms in the ED.  Has had prior cardiac workup.  EKG chest x-ray serial troponins in the ED are unremarkable.  With atypical symptoms, low suspicion for ACS.  She is suitable for outpatient follow-up.       FINAL CLINICAL IMPRESSION(S) / ED DIAGNOSES   Final diagnoses:  Nonspecific chest pain     Rx / DC Orders   ED Discharge Orders     None        Note:  This document was prepared using Dragon voice recognition software and may include unintentional dictation errors.   Viviann Pastor, MD 09/25/23 2248

## 2023-09-25 NOTE — ED Provider Triage Note (Signed)
 Emergency Medicine Provider Triage Evaluation Note  ETHELENE CLOSSER , a 89 y.o. female  was evaluated in triage.  Pt complains of chest pain. History of A-fib on thinner. Last dose was this morning.SABRA  Physical Exam  Ht 5' (1.524 m)   Wt 77.1 kg   BMI 33.20 kg/m  Gen:   Awake, no distress   Resp:  Normal effort  MSK:   Moves extremities without difficulty  Other:    Medical Decision Making  Medically screening exam initiated at 2:20 PM.  Appropriate orders placed.  Ronal LITTIE Ford was informed that the remainder of the evaluation will be completed by another provider, this initial triage assessment does not replace that evaluation, and the importance of remaining in the ED until their evaluation is complete.  Chest pain protocol started. Paced rhythm on ECG.   Herlinda Kirk NOVAK, FNP 09/25/23 1421

## 2023-09-25 NOTE — ED Triage Notes (Addendum)
 Pt to ED for c/o chest pain that began this morning, states it feels like someone is sitting on my chest. Rates pain at 10/10.  Pt has pacemaker.

## 2023-09-25 NOTE — Telephone Encounter (Signed)
 Received the following from scheduling.  Appointment Request From: Ronal LITTIE Ford   With Provider: Timothy Gollan [facility] [place]]   Preferred Date Range: 09/25/2023 - 09/25/2023   Preferred Times: Any   Reason for visit: Office Visit   Health Maintenance Topic:    Comments: Not feeling well.  Uncomfortable in chest.     Called patient and left message for callback.

## 2023-09-28 ENCOUNTER — Ambulatory Visit: Admitting: Cardiovascular Disease

## 2023-09-28 DIAGNOSIS — I110 Hypertensive heart disease with heart failure: Secondary | ICD-10-CM | POA: Diagnosis not present

## 2023-09-28 DIAGNOSIS — I4891 Unspecified atrial fibrillation: Secondary | ICD-10-CM | POA: Diagnosis not present

## 2023-09-28 DIAGNOSIS — R0789 Other chest pain: Secondary | ICD-10-CM | POA: Diagnosis not present

## 2023-09-28 DIAGNOSIS — R079 Chest pain, unspecified: Secondary | ICD-10-CM | POA: Diagnosis not present

## 2023-09-28 DIAGNOSIS — I081 Rheumatic disorders of both mitral and tricuspid valves: Secondary | ICD-10-CM | POA: Diagnosis not present

## 2023-09-28 DIAGNOSIS — J45909 Unspecified asthma, uncomplicated: Secondary | ICD-10-CM | POA: Diagnosis not present

## 2023-09-28 DIAGNOSIS — I2510 Atherosclerotic heart disease of native coronary artery without angina pectoris: Secondary | ICD-10-CM | POA: Diagnosis not present

## 2023-09-28 DIAGNOSIS — R0989 Other specified symptoms and signs involving the circulatory and respiratory systems: Secondary | ICD-10-CM | POA: Diagnosis not present

## 2023-09-28 DIAGNOSIS — I495 Sick sinus syndrome: Secondary | ICD-10-CM | POA: Diagnosis not present

## 2023-09-28 DIAGNOSIS — I5033 Acute on chronic diastolic (congestive) heart failure: Secondary | ICD-10-CM | POA: Diagnosis not present

## 2023-09-28 DIAGNOSIS — I10 Essential (primary) hypertension: Secondary | ICD-10-CM | POA: Diagnosis not present

## 2023-09-28 DIAGNOSIS — R9431 Abnormal electrocardiogram [ECG] [EKG]: Secondary | ICD-10-CM | POA: Diagnosis not present

## 2023-09-28 DIAGNOSIS — Z7901 Long term (current) use of anticoagulants: Secondary | ICD-10-CM | POA: Diagnosis not present

## 2023-09-28 DIAGNOSIS — Z7982 Long term (current) use of aspirin: Secondary | ICD-10-CM | POA: Diagnosis not present

## 2023-09-28 DIAGNOSIS — I739 Peripheral vascular disease, unspecified: Secondary | ICD-10-CM | POA: Diagnosis not present

## 2023-09-28 DIAGNOSIS — E785 Hyperlipidemia, unspecified: Secondary | ICD-10-CM | POA: Diagnosis not present

## 2023-09-28 DIAGNOSIS — E039 Hypothyroidism, unspecified: Secondary | ICD-10-CM | POA: Diagnosis not present

## 2023-09-28 NOTE — Progress Notes (Deleted)
 Cardiology Office Note  Date:  09/28/2023   ID:  ARPI DIEBOLD, DOB 05-02-1933, MRN [id number]  PCP:  Justus Leita DEL, MD   No chief complaint on file.   HPI:  [name] is a 89 year old woman with past medical history of Back pain, anxiety, Hypertension, hyperlipidemia DVT in summer 2022,  CAD,  s/p stents to proximal LAD and mid RCA, 2013 .  Hx of PVCs and PACs.  Pacer Who presents by referral from Dr. Leita Justus for tachybradycardia syndrome, coronary artery disease  Last seen by myself in clinic January 2024 Seen by one of our providers June 2025  Echo September 2024 normal ejection fraction 55 to 60%  Seen in the emergency room September 25, 2023 chest pain started this morning at about 11:00, lasted for an hour and then resolved. Nonradiating, no diaphoresis vomiting or shortness of breath. No exertional symptoms.  - Troponins and other cardiac workup negative    ED visit in June to [facility] for mild fluid overload.   Notes from [facility], previously followed by cardiology 1) Tachy-Bradycardia/SVT/NSVT A. 7-day monitor 10/23-10/30/17: showing frequent atrial and ventricular ectopy, SVT and NSVT, with an average HR of 69, low 50, high 179, 15% PACs, 2.4% PVCs B. EKG at primary cardiology appointment showing NSR with PACs, HR 63  C. Treatment with Sotalol  D. Event Monitor 6/4-08/26/2016: NSR with PAF, HR 40-119 bpm, Average 60 bpm E. Continued symptoms of palpitations on low dose Sotalol , also with bradycardia on low dose Sotalol  F. implantation of a DDDR permanent pacemaker [employer] 11/07/2016   2) SVT/PAC/PVC, started on sotalol  at prior cardiology office   3) Coronary artery disease  A. History of stents to proximal LAD and mid RCA  B. Transthoracic echocardiogram 10/2014 showing EF 60-65% with no significant valvular disease  C. Left heart catheterization 03/27/15 showing mild to moderate CAD with patent stents noted to the proximal LAD and mid RCA,  EF 60%  D. Transthoracic echocardiogram 10/08/15 showing EF 70-75% and no significant valvular disease  E. Negative stress test 09/2015 - data deficient  F. LHC 01/09/16: reported no change from [facility] in January G. LHC 06/2016: mild CAD, patent stents in prox LAD and mid large dominant RCA.  Manual Device Interrogation: July 2023  6 [facility] DDDR, Normal Function. 4 years on battery. 76% RA paced 3% burden, 4% AT at 170-180 bpm   Most recent left heart catheterization by Dr. Lander  demonstrated no significant CAD and elevated left heart pressures. Patent stents, She was placed on daily Lasix    EKG personally reviewed by myself on todays visit Normal sinus rhythm rate 70 bpm QT 474  PMH:   has a past medical history of CAD (coronary artery disease), Hyperlipidemia, Hypertension, Hypothyroidism, IBS (irritable bowel syndrome), Osteoarthritis, PAF (paroxysmal atrial fibrillation) (HCC), PVD (peripheral vascular disease) (HCC), SVT (supraventricular tachycardia) (HCC), and Tachy-brady syndrome (HCC).  PSH:    Past Surgical History:  Procedure Laterality Date   ABDOMINAL HYSTERECTOMY     ANTERIOR FUSION CERVICAL SPINE     twice   CHOLECYSTECTOMY     CORONARY ANGIOPLASTY WITH STENT PLACEMENT     proximal LAD and mid RCA   LUMBAR DISC SURGERY     Pace maker  11/06/2016   Medtronic Boston Scietific Pacemaker Model L311/ Serial [id number]   THYROIDECTOMY Left     Current Outpatient Medications  Medication Sig Dispense Refill   Acetaminophen (TYLENOL PO) Take by mouth as needed.  alendronate  (FOSAMAX ) 70 MG tablet Take 1 tablet by mouth once a week 12 tablet 3   Aspirin 81 MG CAPS Take 1 tablet by mouth daily.     Calcium 200 MG TABS Take 600 mg by mouth 2 (two) times daily.     Cholecalciferol (VITAMIN D3) 250 MCG (10000 UT) CAPS Take by mouth.     clopidogrel  (PLAVIX ) 75 MG tablet Take 1 tablet by mouth once daily 90 tablet 1   furosemide  (LASIX ) 20 MG tablet Take 1 tablet (20 mg total) by  mouth daily as needed. (Patient not taking: Reported on 09/07/2023) 90 tablet 3   gabapentin  (NEURONTIN ) 100 MG capsule Take 1 capsule by mouth at bedtime (Patient not taking: Reported on 09/07/2023) 90 capsule 0   levothyroxine  (SYNTHROID ) 50 MCG tablet TAKE 1 TABLET BY MOUTH ONCE DAILY BEFORE BREAKFAST 90 tablet 0   LORazepam  (ATIVAN ) 0.5 MG tablet Take 1 tablet (0.5 mg total) by mouth 2 (two) times daily as needed. for anxiety 60 tablet 3   mirtazapine  (REMERON ) 30 MG tablet Take 1 tablet (30 mg total) by mouth daily. 90 tablet 1   NIFEdipine  (PROCARDIA -XL/NIFEDICAL-XL) 30 MG 24 hr tablet TAKE ONE TABLET (30 MG) BY MOUTH ONCE DAILY. 90 tablet 2   nitroGLYCERIN (NITROSTAT) 0.4 MG SL tablet Place under the tongue.     oxybutynin  (DITROPAN -XL) 10 MG 24 hr tablet Take 1 tablet by mouth once daily 90 tablet 3   pantoprazole  (PROTONIX ) 40 MG tablet Take 1 tablet (40 mg total) by mouth 2 (two) times daily. 180 tablet 1   pravastatin  (PRAVACHOL ) 40 MG tablet Take 1 tablet by mouth once daily 90 tablet 0   sacubitril-valsartan (ENTRESTO ) 24-26 MG Take 1 tablet by mouth twice daily 180 tablet 3   SOTALOL  AF 80 MG TABS Take 1 tablet by mouth twice daily 180 tablet 3   traMADol (ULTRAM) 50 MG tablet Take 50 mg by mouth every 6 (six) hours as needed.     No current facility-administered medications for this visit.    Allergies:   Penicillins, Other, and Sulfa antibiotics   Social History:  The patient  reports that she has never smoked. She has never used smokeless tobacco. She reports that she does not drink alcohol and does not use drugs.   Family History:   family history includes Cancer in her mother; Diabetes in her sister; Hearing loss in her father; Heart attack in her father; Heart disease in her brother and sister; Hypertension in her sister; Liver cancer in her sister; Lung disease in her brother; Pancreatic cancer in her sister.    Review of Systems: Review of Systems  Constitutional:  Negative.   HENT: Negative.    Respiratory: Negative.    Cardiovascular: Negative.   Gastrointestinal: Negative.   Musculoskeletal: Negative.   Neurological: Negative.   Psychiatric/Behavioral: Negative.    All other systems reviewed and are negative.   PHYSICAL EXAM: VS:  There were no vitals taken for this visit. , BMI There is no height or weight on file to calculate BMI. GEN: Well nourished, well developed, in no acute distress HEENT: normal Neck: no JVD, carotid bruits, or masses Cardiac: RRR; no murmurs, rubs, or gallops, trace nonpitting lower extremity edema  Respiratory:  clear to auscultation bilaterally, normal work of breathing GI: soft, nontender, nondistended, + BS MS: no deformity or atrophy Skin: warm and dry, no rash Neuro:  Strength and sensation are intact Psych: euthymic mood, full affect  Recent Labs: 10/08/2022:  ALT 12; TSH 1.370 08/19/2023: Magnesium  1.6 09/25/2023: BUN 11; Creatinine, Ser 0.84; Hemoglobin 13.3; Platelets 230; Potassium 3.9; Sodium 140    Lipid Panel Lab Results  Component Value Date   CHOL 173 10/08/2022   HDL 52 10/08/2022   LDLCALC 85 10/08/2022   TRIG 219 (H) 10/08/2022      Wt Readings from Last 3 Encounters:  09/25/23 170 lb (77.1 kg)  09/07/23 170 lb (77.1 kg)  08/19/23 175 lb 6.4 oz (79.6 kg)      ASSESSMENT AND PLAN:  Problem List Items Addressed This Visit   None   Tachybradycardia syndrome New to the area, previously placed on sotalol  for tachyarrhythmia No documentation of atrial fibrillation per outpatient notes reviewed Currently asymptomatic, we will continue sotalol  for now Family reports she is scheduled to have repeat lab work with primary care possibly later this week  Coronary disease with stable angina Currently with no symptoms of angina. No further workup at this time. Continue current medication regimen. Recommend she request lipid panel from primary care Goal LDL less than 70 Several  catheterizations over the past several years, stable disease, patent stents Remains on aspirin Plavix , she feels comfortable staying on both of these for now but we did discuss possibly dropping the Plavix   Pacemaker Presumed for symptomatic bradycardia Pacemaker in place, will arrange follow-up with the EP  Hyperlipidemia Continue pravastatin , goal LDL less than 70   Total encounter time more than 60 minutes  Greater than 50% was spent in counseling and coordination of care with the patient    Signed, Velinda Lunger, M.D., Ph.D. [facility] [place], [place] [phone]

## 2023-09-28 NOTE — Telephone Encounter (Signed)
 Called patient to check current S&S, patient denies S&S since being in ED on 8/1  Scheduled appointment with Dr. Gollan for 09/28/23 at 4:00

## 2023-09-28 NOTE — ED Provider Notes (Signed)
 [facility] Emergency Department Provider Note    ED Clinical Impression    Final diagnoses:  Atypical chest pain (Primary)        Impression, Medical Decision Making, Progress Notes and Critical Care    Impression, Differential Diagnosis and Plan of Care  Sandra Murphy is a 89 y.o. female with a past medical history of paroxysmal A-fib (on eliquis), tachy-brady syndrome (s/p pacemaker), CAD (s/p PCI to proximal LAD and mid RCA), HTN, HLD, PVD, GERD, and IBS who presents with two episodes of nonexertional chest pain, dull and achy in nature, on Friday and again today. This is her 2nd ED visit for these symptoms with previous negative work up on 8/1.   Vitals are within normal limits. On exam, the patient appears nontoxic, in no acute distress. Heart sounds normal. Pulses symmetric. No asymmetric lower extremity edema.   Differential includes ACS, angina, arrhythmia, anemia, GERD. Considered aortic dissection as she does describe pain between her shoulder blades, but that pain is more chronic and her exam is otherwise reassuring against this. Similarly, lower suspicion for PE based on description of the symptoms and lack of major risk factors for this. Lower suspicion for pneumothorax or pneumonia, but she did describe some exertional shortness of breath so will evaluate on CXR. Plan for EKG, basic labs, serial troponin, pro-BNP, PT-INR, and CXR.  2042 Work up so far with pulmonary vascular congestion on CXR and Pro-BNP 1221. Bedside echo with grossly normal EF, left atrium appears dilated, possible small pericardial effusion. IVC appears dilated. Although she does not have a history of CHF, will give a dose of Lasix  based on this constellation of findings. Otherwise, EKG and troponins reassuring against ACS. Hemoglobin normal. No major electrolyte or metabolic derangements. CXR without pneumonia or pneumothorax. Mediastinum similar to prior.   2107 Given her recurrent ED  visits with ongoing symptoms, h/o of CAD, and need for reevaluation after diuresis, will admit.  Independent Interpretation of Studies: EKG: atrially paced at 72 bpm, normal axis, nonspecific ST/T wave changes similar to prior. CXR: as above. External Records Reviewed: Patient's most recent outside Emergency Department visit Escalation of Care, Consideration of Admission/Observation/Transfer: as above Social determinants that significantly affected care: None applicable History obtained from other sources: Family   Portions of this record have been created using Scientist, clinical (histocompatibility and immunogenetics). Dictation errors have been sought, but may not have been identified and corrected.  See chart and resident provider documentation for details.  ____________________________________________      History     Reason for Visit Chest Pain   HPI  Sandra Murphy is a 89 y.o. female with a past medical history of paroxysmal A-fib (on eliquis), tachy-brady syndrome (s/p pacemaker), CAD (s/p PCI to proximal LAD and mid RCA), HTN, HLD, PVD, GERD, and IBS who presents with chest pain. The patient reports two episodes of chest pain, dull and achy in nature, on Friday and again today. She also describes thoracic back pain between her shoulder blades; however, she states this is chronic. She initially noticed sudden onset of central chest pressure on Friday (8/1) which began while she was relaxing in her recliner and lasted around 2 hours before it gradually abated. No pain on Saturday or Sunday. Her pain returned this afternoon while she was making her bed. She was supposed to go to PT today, but she states that her cardiology team recommended ED evaluation to rule out cardiac emergency given return of her chest pain. No shortness of breath,  diaphoresis, nausea, emesis, or syncope preceding either episode. History of similar prior to Friday's event. Her pain has improved since onset today. Her daughter states that the  patient's spine provider recently started tramadol on 09/03/23, otherwise no medication changes. Of note, the patient adds that she has been experiencing exertional shortness of breath for a while now. She reports an isolated episode of non-bloody diarrhea earlier today. No recent prolonged travel. No new LE edema. No history of blood clots. No recent surgeries. No tobacco use. No alcohol or illicit drug use. History of hiatal hernia, however this pain is different than the chest pain she describes today. She takes Nexium. No carafate. Denies fevers, emesis, rhinorrhea, cough, congestion, hematochezia, or melena.  On chart review, the patient was seen at [facility] ED on 09/25/23 for the same where her lab workup including CBC, BMP, serial troponins were all reassuring. BMP showed glucose mildly elevated to 108, otherwise unremarkable. Her EKG showed sinus rhythm with rate of 71, atrial paced, first-degree AV block. CXR negative. After a negative cardiac workup, the patient was discharged home in stable condition.   The patient had her pacemaker interrogated on 08/24/23 which showed normal functioning of her pacemaker. Previous echo from 10/31/22 shows EF 55-60%.  Past Medical History[1]  Problem List[2]  Past Surgical History[3]  No current facility-administered medications for this encounter.  Current Outpatient Medications:  .  acetaminophen (TYLENOL) 500 MG tablet, Take 2 tablets (1,000 mg total) by mouth once as needed for pain., Disp: , Rfl:  .  albuterol HFA 90 mcg/actuation inhaler, Inhale 2 puffs every four (4) hours as needed for wheezing or shortness of breath., Disp: 8.5 g, Rfl: 0 .  alendronate  (FOSAMAX ) 70 MG tablet, Take 1 tablet (70 mg total) by mouth every seven (7) days., Disp: , Rfl:  .  aspirin (ECOTRIN) 81 MG tablet, Take 1 tablet (81 mg total) by mouth daily., Disp: , Rfl:  .  clopidogreL  (PLAVIX ) 75 mg tablet, Take 1 tablet (75 mg total) by mouth daily., Disp: , Rfl:  .   ergocalciferol-1,250 mcg, 50,000 unit, (VITAMIN D2-1,250 MCG, 50,000 UNIT,) 1,250 mcg (50,000 unit) capsule, Take 1 capsule (1,250 mcg total) by mouth once a week., Disp: , Rfl:  .  furosemide  (LASIX ) 20 MG tablet, Take 1 tablet (20 mg total) by mouth daily as needed. Unsure on dose, Disp: , Rfl:  .  isosorbide mononitrate (IMDUR) 120 MG 24 hr tablet, Take 1 tablet (120 mg total) by mouth two (2) times a day., Disp: , Rfl:  .  levothyroxine  (SYNTHROID ) 50 MCG tablet, Take 1 tablet (50 mcg total) by mouth daily., Disp: , Rfl:  .  LORazepam  (ATIVAN ) 0.5 MG tablet, Take 1 tablet (0.5 mg total) by mouth two (2) times a day as needed., Disp: , Rfl:  .  mirtazapine  (REMERON ) 30 MG tablet, Take 1 tablet (30 mg total) by mouth nightly., Disp: , Rfl:  .  NIFEdipine  (PROCARDIA  XL) 30 MG 24 hr tablet, Take 2 tablets (60 mg total) by mouth daily., Disp: , Rfl:  .  oxybutynin  (DITROPAN -XL) 10 MG 24 hr tablet, Take 1 tablet (10 mg total) by mouth daily., Disp: , Rfl:  .  pantoprazole  (PROTONIX ) 40 MG tablet, Take 1 tablet (40 mg total) by mouth two (2) times a day., Disp: , Rfl:  .  pravastatin  (PRAVACHOL ) 40 MG tablet, Take 1 tablet (40 mg total) by mouth daily., Disp: , Rfl:  .  sotaloL  (BETAPACE ) 80 MG tablet, Take 1  tablet (80 mg total) by mouth two (2) times a day., Disp: , Rfl:   Allergies Penicillins and Sulfa (sulfonamide antibiotics)  Family History[4]  Social History Short Social History[5]    Physical Exam   ED Triage Vitals  Enc Vitals Group     BP 09/28/23 1400 161/146     Pulse 09/28/23 1400 71     SpO2 Pulse 09/28/23 1808 71     Resp 09/28/23 1400 18     Temp 09/28/23 1400 36.7 C (98 F)     Temp Source 09/28/23 1400 Temporal     SpO2 09/28/23 1400 95 %     Weight --      Height --    Constitutional: Alert and oriented. Well appearing and in no distress. Eyes: Conjunctivae are normal. ENT      Head: Normocephalic and atraumatic.      Nose: No congestion.      Mouth/Throat:  Mucous membranes are moist.      Neck: No stridor. Cardiovascular: Normal rate, regular rhythm. Normal and symmetric distal pulses are present in all extremities.  Respiratory: Normal respiratory effort. Breath sounds are normal. Gastrointestinal: Soft and nontender. Genitourinary: Deferred. Musculoskeletal: Normal range of motion in all extremities. No asymmetric LE edema. Neurologic: Normal speech and language. No gross focal neurologic deficits are appreciated. Skin: Skin is warm, dry and intact. No rash noted. Psychiatric: Mood and affect are normal. Speech and behavior are normal.    Radiology   XR Chest 2 views  Final Result    Pulmonary vascular congestion.            Documentation assistance was provided by Lamarr Edison, Scribe, on September 28, 2023 at 7:09 PM for Maglin Halsey-Nichols, MD.  Documentation assistance was provided by the scribe in my presence.  The documentation recorded by the scribe has been reviewed by me and accurately reflects the services I personally performed.        [1] Past Medical History: Diagnosis Date  . Asthma (HHS-HCC)   . Coronary artery disease involving native coronary artery without angina pectoris    stent 2013  . Hypertension 11/14/2020  . Hypothyroid   . Osteoporosis   [2] Patient Active Problem List Diagnosis  . Acute hypoxemic respiratory failure     . Asthma (HHS-HCC)  . Hypertension  . Hx of heart artery stent  . CAD (coronary artery disease)  . Weakness  . HLD (hyperlipidemia)  . PVD (peripheral vascular disease) (CMS-HCC)  . Osteoporosis  . Hiatal hernia  . Hx of deep venous thrombosis  [3] Past Surgical History: Procedure Laterality Date  . CARDIAC PACEMAKER PLACEMENT    . CORONARY ANGIOPLASTY WITH STENT PLACEMENT  2013  [4] Family History Problem Relation Age of Onset  . Cancer Mother   . Coronary artery disease Father   [5] Social History Tobacco Use  . Smoking status: Never  . Smokeless tobacco:  Never  Vaping Use  . Vaping status: Never Used  Substance Use Topics  . Alcohol use: Not Currently  . Drug use: Never   Halsey-Nichols, Reba Maglin, MD 10/06/23 2116

## 2023-09-29 DIAGNOSIS — I2510 Atherosclerotic heart disease of native coronary artery without angina pectoris: Secondary | ICD-10-CM | POA: Diagnosis not present

## 2023-09-29 DIAGNOSIS — I5033 Acute on chronic diastolic (congestive) heart failure: Secondary | ICD-10-CM | POA: Diagnosis not present

## 2023-09-29 DIAGNOSIS — E785 Hyperlipidemia, unspecified: Secondary | ICD-10-CM | POA: Diagnosis not present

## 2023-09-29 DIAGNOSIS — I739 Peripheral vascular disease, unspecified: Secondary | ICD-10-CM | POA: Diagnosis not present

## 2023-09-29 DIAGNOSIS — I509 Heart failure, unspecified: Secondary | ICD-10-CM | POA: Diagnosis not present

## 2023-09-29 DIAGNOSIS — R0789 Other chest pain: Secondary | ICD-10-CM | POA: Diagnosis not present

## 2023-09-30 DIAGNOSIS — I2510 Atherosclerotic heart disease of native coronary artery without angina pectoris: Secondary | ICD-10-CM | POA: Diagnosis not present

## 2023-09-30 DIAGNOSIS — E785 Hyperlipidemia, unspecified: Secondary | ICD-10-CM | POA: Diagnosis not present

## 2023-09-30 DIAGNOSIS — I739 Peripheral vascular disease, unspecified: Secondary | ICD-10-CM | POA: Diagnosis not present

## 2023-09-30 DIAGNOSIS — I5033 Acute on chronic diastolic (congestive) heart failure: Secondary | ICD-10-CM | POA: Diagnosis not present

## 2023-09-30 DIAGNOSIS — R0789 Other chest pain: Secondary | ICD-10-CM | POA: Diagnosis not present

## 2023-10-01 ENCOUNTER — Telehealth: Payer: Self-pay

## 2023-10-01 NOTE — Transitions of Care (Post Inpatient/ED Visit) (Signed)
   10/01/2023  Name: Sandra Murphy MRN: [id number] DOB: June 04, 1933  Today's TOC FU Call Status: Today's TOC FU Call Status:: Unsuccessful Call (1st Attempt) Unsuccessful Call (1st Attempt) Date: 10/01/23  Attempted to reach the patient regarding the most recent Inpatient/ED visit.  Follow Up Plan: Additional outreach attempts will be made to reach the patient to complete the Transitions of Care (Post Inpatient/ED visit) call.   Signature Julian Lemmings, LPN [facility] Nurse Health Advisor Direct Dial [phone]

## 2023-10-02 NOTE — Transitions of Care (Post Inpatient/ED Visit) (Signed)
   10/02/2023  Name: Sandra Murphy MRN: [id number] DOB: 13-Jun-1933  Today's TOC FU Call Status: Today's TOC FU Call Status:: Unsuccessful Call (2nd Attempt) Unsuccessful Call (1st Attempt) Date: 10/01/23 Unsuccessful Call (2nd Attempt) Date: 10/02/23  Attempted to reach the patient regarding the most recent Inpatient/ED visit.  Follow Up Plan: Additional outreach attempts will be made to reach the patient to complete the Transitions of Care (Post Inpatient/ED visit) call.   Signature Julian Lemmings, LPN [facility] Nurse Health Advisor Direct Dial [phone]

## 2023-10-06 NOTE — Transitions of Care (Post Inpatient/ED Visit) (Signed)
 10/06/2023  Name: Sandra Murphy MRN: [id number] DOB: [birth year]  Today's TOC FU Call Status: Today's TOC FU Call Status:: Successful TOC FU Call Completed Unsuccessful Call (1st Attempt) Date: 10/01/23 Unsuccessful Call (2nd Attempt) Date: 10/02/23 [facility] FU Call Complete Date: 10/06/23 Patient's Name and Date of Birth confirmed.  Transition Care Management Follow-up Telephone Call Date of Discharge: 09/30/23 Discharge Facility: Other [facility]) Name of Other ([facility]) Discharge Facility: [facility] Type of Discharge: Inpatient Admission Primary Inpatient Discharge Diagnosis:: chest pain How have you been since you were released from the hospital?: Better Any questions or concerns?: No  Items Reviewed: Did you receive and understand the discharge instructions provided?: Yes Medications obtained,verified, and reconciled?: Yes (Medications Reviewed) Any new allergies since your discharge?: No Dietary orders reviewed?: Yes Do you have support at home?: Yes People in Home [RPT]: child(ren), adult  Medications Reviewed Today: Medications Reviewed Today     Reviewed by Emmitt Pan, LPN (Licensed Practical Nurse) on 10/06/23 at 1150  Med List Status: <None>   Medication Order Taking? Sig Documenting Provider Last Dose Status Informant  Acetaminophen (TYLENOL PO) [id number] Yes Take by mouth as needed. [provider]  Active   alendronate  (FOSAMAX ) 70 MG tablet [id number] Yes Take 1 tablet by mouth once a week Justus Leita DEL, MD  Active   Aspirin 81 MG CAPS [id number] Yes Take 1 tablet by mouth daily. [provider]  Active   Calcium 200 MG TABS [id number] Yes Take 600 mg by mouth 2 (two) times daily. [provider]  Active   Cholecalciferol (VITAMIN D3) 250 MCG (10000 UT) CAPS [id number] Yes Take by mouth. [provider]  Active   clopidogrel  (PLAVIX ) 75 MG tablet [id number] Yes Take 1 tablet by mouth once daily Gollan, Timothy J, MD  Active    furosemide  (LASIX ) 20 MG tablet [id number]  Take 1 tablet (20 mg total) by mouth daily as needed.  Patient not taking: Reported on 10/06/2023   Riddle, Suzann, NP  Active   gabapentin  (NEURONTIN ) 100 MG capsule [id number]  Take 1 capsule by mouth at bedtime  Patient not taking: Reported on 10/06/2023   Justus Leita DEL, MD  Active   levothyroxine  (SYNTHROID ) 50 MCG tablet [id number] Yes TAKE 1 TABLET BY MOUTH ONCE DAILY BEFORE BREAKFAST Berglund, Laura H, MD  Active   LORazepam  (ATIVAN ) 0.5 MG tablet [id number] Yes Take 1 tablet (0.5 mg total) by mouth 2 (two) times daily as needed. for anxiety Justus Leita DEL, MD  Active   mirtazapine  (REMERON ) 30 MG tablet [id number] Yes Take 1 tablet (30 mg total) by mouth daily. Justus Leita DEL, MD  Active   NIFEdipine  (PROCARDIA -XL/NIFEDICAL-XL) 30 MG 24 hr tablet [id number] Yes TAKE ONE TABLET (30 MG) BY MOUTH ONCE DAILY. Gollan, Timothy J, MD  Active   nitroGLYCERIN (NITROSTAT) 0.4 MG SL tablet [id number] Yes Place under the tongue. [provider]  Active   oxybutynin  (DITROPAN -XL) 10 MG 24 hr tablet [id number] Yes Take 1 tablet by mouth once daily Berglund, Laura H, MD  Active   pantoprazole  (PROTONIX ) 40 MG tablet [id number] Yes Take 1 tablet (40 mg total) by mouth 2 (two) times daily. Justus Leita DEL, MD  Active   pravastatin  (PRAVACHOL ) 40 MG tablet [id number] Yes Take 1 tablet by mouth once daily Gollan, Timothy J, MD  Active   sacubitril-valsartan (ENTRESTO ) 24-26 MG [id number] Yes Take 1 tablet by mouth twice daily Gollan, Timothy J, MD  Active   SOTALOL  AF 80 MG TABS  [id number] Yes Take 1 tablet by mouth twice daily Gollan, Timothy J, MD  Active   traMADol (ULTRAM) 50 MG tablet [id number] Yes Take 50 mg by mouth every 6 (six) hours as needed. [provider]  Active             Home Care and Equipment/Supplies: Were Home Health Services Ordered?: NA Any new equipment or medical supplies ordered?: NA  Functional  Questionnaire: Do you need assistance with bathing/showering or dressing?: No Do you need assistance with meal preparation?: No Do you need assistance with eating?: No Do you have difficulty maintaining continence: No Do you need assistance with getting out of bed/getting out of a chair/moving?: No Do you have difficulty managing or taking your medications?: No  Follow up appointments reviewed: PCP Follow-up appointment confirmed?: Yes Date of PCP follow-up appointment?: 10/07/23 Follow-up Provider: [facility] Follow-up appointment confirmed?: Yes Date of Specialist follow-up appointment?: 10/08/23 Follow-Up Specialty Provider:: cardio Do you need transportation to your follow-up appointment?: No Do you understand care options if your condition(s) worsen?: Yes-patient verbalized understanding    SIGNATURE Julian Lemmings, LPN [facility] Nurse Health Advisor Direct Dial [phone]

## 2023-10-07 ENCOUNTER — Encounter: Payer: Medicare HMO | Admitting: Internal Medicine

## 2023-10-07 ENCOUNTER — Ambulatory Visit: Admitting: Student

## 2023-10-08 ENCOUNTER — Ambulatory Visit: Admitting: Cardiology

## 2023-10-08 ENCOUNTER — Ambulatory Visit: Attending: Cardiology | Admitting: Physician Assistant

## 2023-10-08 ENCOUNTER — Encounter: Payer: Self-pay | Admitting: Cardiology

## 2023-10-08 VITALS — BP 116/75 | HR 73 | Resp 20 | Ht 60.0 in | Wt 172.0 lb

## 2023-10-08 DIAGNOSIS — I495 Sick sinus syndrome: Secondary | ICD-10-CM | POA: Diagnosis not present

## 2023-10-08 DIAGNOSIS — I480 Paroxysmal atrial fibrillation: Secondary | ICD-10-CM

## 2023-10-08 DIAGNOSIS — E785 Hyperlipidemia, unspecified: Secondary | ICD-10-CM | POA: Diagnosis not present

## 2023-10-08 DIAGNOSIS — I10 Essential (primary) hypertension: Secondary | ICD-10-CM

## 2023-10-08 DIAGNOSIS — Z79899 Other long term (current) drug therapy: Secondary | ICD-10-CM

## 2023-10-08 DIAGNOSIS — I2510 Atherosclerotic heart disease of native coronary artery without angina pectoris: Secondary | ICD-10-CM

## 2023-10-08 DIAGNOSIS — E782 Mixed hyperlipidemia: Secondary | ICD-10-CM | POA: Diagnosis not present

## 2023-10-08 DIAGNOSIS — I5189 Other ill-defined heart diseases: Secondary | ICD-10-CM | POA: Diagnosis not present

## 2023-10-08 MED ORDER — SPIRONOLACTONE 25 MG PO TABS: 12.5000 mg | ORAL_TABLET | Freq: Every day | ORAL | 3 refills | Status: AC

## 2023-10-08 NOTE — Progress Notes (Signed)
 Cardiology Office Note    Date:  10/08/2023   ID:  ADY HEIMANN, DOB 1934-02-19, MRN [id number]  PCP:  Justus Leita DEL, MD  Cardiologist:  Evalene Lunger, MD  Electrophysiologist:  OLE ONEIDA HOLTS, MD   Chief Complaint: Hospital follow up  History of Present Illness:   Sandra Murphy is a 89 y.o. female with history of CAD, tachybradycardia syndrome s/p permanent pacemaker, PVCs, PACs, hypertension, hyperlipidemia, DVT, and anxiety who presents for hospital follow up.     Sandra Murphy previously underwent stenting of the proximal LAD and mid RCA in 2013.  In October 2017, Sandra Murphy underwent event monitoring in the setting of palpitations, which showed frequent atrial and ventricular ectopy, SVT, and nonsustained VT.  Sandra Murphy had a 15% PAC burden and 2.4% PVC burden.   Most recent diagnostic catheterization in May 2018 showed patent LAD and RCA stents with otherwise nonobstructive disease.  In the setting of ongoing palpitations, 30-day event monitor in June 2018 showed sinus rhythm with paroxysmal atrial fibrillation.  In the setting of tachybradycardia syndrome, Sandra Murphy underwent [employer] dual-chamber permanent pacemaker placement in September 2018.  Sandra Murphy has been on sotalol  therapy for frequent PACs.   Sandra Murphy established care with Dr. Gollan in January 2024.  Sandra Murphy has since been comanaged with our electrophysiology team.  In October 2024, Sandra Murphy was evaluated for chest pain in the [facility] emergency department.  At office follow-up November 2024, Sandra Murphy was offered stress testing but deferred.  At electrophysiology follow-up in January 14, 2023, Sandra Murphy was noted to have less than 1% A-fib burden and device adjustments were made to further monitor her A-fib episodes.  Most recent remote transition on February 19, 2023, showed approximately 2% atrial high rate burden with EGM showing atrial tachycardia/flutter, and Sandra Murphy was maintained on sotalol , aspirin, and Plavix .    Patient was most recently seen in  the cardiology clinic 03/04/2023 doing reasonably well from a cardiac perspective.  Sandra Murphy reported a recent episode of tachycardia and chronic stable dyspnea on exertion.  No further testing or medication changes were indicated. Sandra Murphy was seen by EP 08/19/2023 and reported worsening lower extremity edema.  Sandra Murphy was given Lasix  20 mg as needed for increased swelling.  Patient presented to [facility] ED with central chest heaviness on 09/25/2023.  This lasted for 1 hour then spontaneously resolved.  Workup including CBC, BMP, troponin, EKG, and chest x-ray were negative. Patient was admitted to [facility] 09/28/2023 through 09/30/2023 for atypical chest pain.  Sandra Murphy reported increased fluid intake after recent UTI.  Sandra Murphy was thought to have HFpEF exacerbation with proBNP elevated at 1221 and chest x-ray showing pulmonary vascular congestion.  Sandra Murphy was diuresed with IV Lasix .  Cardiology was consulted and patient's chest discomfort was not thought to be ischemic in origin.  Deferred stress testing and recommended outpatient titration of antianginal medications.  Patient presents to clinic today overall doing much better since discharge from the hospital.  Sandra Murphy reports 1 episode of nausea and vomiting this past weekend due to her hiatal hernia.  I suspect that her chest discomfort prior to admission was likely due to hiatal hernia as well.  Sandra Murphy denies any further symptoms of chest pain.  Sandra Murphy is without symptoms of exertional angina and cardiac decompensation.  Denies shortness of breath, lightheadedness, dizziness, palpitations, and lower extremity edema.  Labs independently reviewed: 09/2023 WBC 4.3, Hgb 12.8, HCT 37.0, platelets 198, sodium 143, potassium 3.9, BUN 14, creatinine 0.79, mag 1.9, TC 143, HDL 51,  LDL 68, TG 169  Objective   Past Medical History:  Diagnosis Date   CAD (coronary artery disease)    stents to proximal LAD and mid RCA   Hyperlipidemia    Hypertension    Hypothyroidism    IBS (irritable bowel  syndrome)    Osteoarthritis    PAF (paroxysmal atrial fibrillation) (HCC)    a. Subclinical - detected on device monitoring-->sotalol .  No OAC given low-burden.   PVD (peripheral vascular disease) (HCC)    SVT (supraventricular tachycardia) (HCC)    Tachy-brady syndrome (HCC)    a. 10/2016 s/p BSX L311 Accolade MRI DC PPM.    Current Medications: No outpatient medications have been marked as taking for the 10/08/23 encounter (Appointment) with Gerard Frederick, NP.    Allergies:   Penicillins, Other, and Sulfa antibiotics   Social History   Socioeconomic History   Marital status: Widowed    Spouse name: Not on file   Number of children: 4   Years of education: Not on file   Highest education level: Not on file  Occupational History   Occupation: Retired  Tobacco Use   Smoking status: Never   Smokeless tobacco: Never  Vaping Use   Vaping status: Never Used  Substance and Sexual Activity   Alcohol use: No   Drug use: Never   Sexual activity: Not on file  Other Topics Concern   Not on file  Social History Narrative   1 stillborn child   Social Drivers of Corporate investment banker Strain: Low Risk  (09/29/2023)   Received from [facility]   Overall Financial Resource Strain (CARDIA)    How hard is it for you to pay for the very basics like food, housing, medical care, and heating?: Not hard at all  Food Insecurity: No Food Insecurity (09/29/2023)   Received from [facility]   Hunger Vital Sign    Within the past 12 months, you worried that your food would run out before you got the money to buy more.: Never true    Within the past 12 months, the food you bought just didn't last and you didn't have money to get more.: Never true  Transportation Needs: No Transportation Needs (09/29/2023)   Received from [facility] - Transportation    Lack of Transportation (Medical): No    Lack of Transportation (Non-Medical): No  Physical Activity: Insufficiently  Active (01/14/2023)   Exercise Vital Sign    Days of Exercise per Week: 2 days    Minutes of Exercise per Session: 20 min  Stress: No Stress Concern Present (01/14/2023)   Sandra Murphy of Occupational Health - Occupational Stress Questionnaire    Feeling of Stress : Not at all  Social Connections: Moderately Isolated (01/14/2023)   Social Connection and Isolation Panel    Frequency of Communication with Friends and Family: More than three times a week    Frequency of Social Gatherings with Friends and Family: More than three times a week    Attends Religious Services: More than 4 times per year    Active Member of [employer] or Organizations: No    Attends Banker Meetings: Never    Marital Status: Widowed     Family History:  The patient's family history includes Cancer in her mother; Diabetes in her sister; Hearing loss in her father; Heart attack in her father; Heart disease in her brother and sister; Hypertension in her sister; Liver cancer in  her sister; Lung disease in her brother; Pancreatic cancer in her sister.  ROS:   12-point review of systems is negative unless otherwise noted in the HPI.  EKGs/Other Studies Reviewed:    Studies reviewed were summarized above. The additional studies were reviewed today:  09/29/2023 Echo complete [facility]) 1. Technically difficult study.    2. The left ventricle is normal in size with mildly increased wall  thickness.   3. The left ventricular systolic function is normal, LVEF is visually  estimated at > 55%.    4. The left atrium is mildly dilated in size.    5. The right ventricle is normal in size, with normal systolic function.   10/31/2022 Echo complete 1. Left ventricular ejection fraction, by estimation, is 55 to 60%. Left  ventricular ejection fraction by 2D MOD biplane is 56.7 %. The left  ventricle has normal function. The left ventricle has no regional wall  motion abnormalities. There is mild left   ventricular hypertrophy. Left ventricular diastolic parameters are  consistent with Grade II diastolic dysfunction (pseudonormalization).   2. Right ventricular systolic function is normal. The right ventricular  size is normal.   3. Left atrial size was mildly dilated.   4. The mitral valve is normal in structure. Mild mitral valve  regurgitation.   5. The aortic valve is tricuspid. Aortic valve regurgitation is not  visualized. Aortic valve sclerosis is present, with no evidence of aortic  valve stenosis.   6. The inferior vena cava is normal in size with greater than 50%  respiratory variability, suggesting right atrial pressure of 3 mmHg.   EKG:  EKG personally reviewed by me today    PHYSICAL EXAM:    VS:  There were no vitals taken for this visit.  BMI: There is no height or weight on file to calculate BMI.  Physical Exam Vitals and nursing note reviewed.  Constitutional:      General: Sandra Murphy is not in acute distress.    Appearance: Normal appearance.  Cardiovascular:     Rate and Rhythm: Normal rate and regular rhythm.     Heart sounds: No murmur heard. Pulmonary:     Effort: Pulmonary effort is normal. No respiratory distress.     Breath sounds: No wheezing or rales.  Musculoskeletal:     Right lower leg: No edema.     Left lower leg: No edema.  Skin:    General: Skin is warm and dry.  Neurological:     General: No focal deficit present.     Mental Status: Sandra Murphy is alert and oriented to person, place, and time. Mental status is at baseline.  Psychiatric:        Mood and Affect: Mood normal.        Behavior: Behavior normal.     Wt Readings from Last 3 Encounters:  09/25/23 170 lb (77.1 kg)  09/07/23 170 lb (77.1 kg)  08/19/23 175 lb 6.4 oz (79.6 kg)        ASSESSMENT & PLAN:   Coronary artery disease - Patient had recent admission at [facility], presenting with atypical chest pain.  EKG and troponin unremarkable.  No further chest pain since  discharge.  Suspect pain was likely due to large hiatal hernia.  No symptoms of exertional angina or cardiac decompensation.  No indication for ischemic evaluation at this time.  Continue aspirin, Plavix , and pravastatin .  Will defer discontinuation of DAPT to primary cardiologist.  Check CBC and BMP today.  Diastolic dysfunction - Recent admission at [facility] for acute on chronic HFpEF. ProBNP elevated and chest x-ray showing pulmonary vascular congestion.  Sandra Murphy was diuresed with IV Lasix .  Denies shortness of breath and lower extremity swelling since discharge.  Sandra Murphy is continued on Entresto  24-26 mg twice daily and Lasix  20 mg daily as needed.  Will start spironolactone  12.5 mg daily.  Recheck BMP at follow-up.  Tachybradycardia syndrome Frequent PACs Atrial tachycardia Atrial flutter/subclinical atrial fibrillation - Denies recent symptoms of palpitations. Managed by EP.  Sandra Murphy remains on sotalol  therapy.  Sandra Murphy is not on anticoagulation in the setting of subclinical nature and low atrial fibrillation/flutter burden.  Primary hypertension - Blood pressure well-controlled.  Sandra Murphy is continued on nifedipine  and Entresto .  Hyperlipidemia - LDL 68.  Sandra Murphy is continued on pravastatin .   Disposition: Check CBC and BMP. Start spironolactone  12.5 mg daily. F/u with Dr. Gollan or an APP in 1 month.   Medication Adjustments/Labs and Tests Ordered: Current medicines are reviewed at length with the patient today.  Concerns regarding medicines are outlined above. Medication changes, Labs and Tests ordered today are summarized above and listed in the Patient Instructions accessible in Encounters.   Bonney Lesley Maffucci, PA-C 10/08/2023 7:58 AM     [facility] [place] [address] [place], [place] [address] [phone]

## 2023-10-08 NOTE — Patient Instructions (Signed)
 Medication Instructions:  Your physician recommends the following medication changes.  START TAKING: Spironolactone  12.5 mg daily *If you need a refill on your cardiac medications before your next appointment, please call your pharmacy*  Lab Work: Your provider would like for you to have following labs drawn today CBC. BMP.   If you have labs (blood work) drawn today and your tests are completely normal, you will receive your results only by: MyChart Message (if you have MyChart) OR A paper copy in the mail If you have any lab test that is abnormal or we need to change your treatment, we will call you to review the results.  Testing/Procedures: No test ordered today   Follow-Up: At [facility], you and your health needs are our priority.  As part of our continuing mission to provide you with exceptional heart care, our providers are all part of one team.  This team includes your primary Cardiologist (physician) and Advanced Practice Providers or APPs (Physician Assistants and Nurse Practitioners) who all work together to provide you with the care you need, when you need it.  Your next appointment:   1 month(s)  Provider:   Timothy Gollan, MD or Lesley Maffucci, PA-C

## 2023-10-09 ENCOUNTER — Other Ambulatory Visit: Payer: Self-pay | Admitting: Internal Medicine

## 2023-10-09 DIAGNOSIS — F411 Generalized anxiety disorder: Secondary | ICD-10-CM

## 2023-10-09 LAB — BASIC METABOLIC PANEL WITH GFR
BUN/Creatinine Ratio: 7 — ABNORMAL LOW (ref 12–28)
BUN: 7 mg/dL — ABNORMAL LOW (ref 8–27)
CO2: 21 mmol/L (ref 20–29)
Calcium: 9.2 mg/dL (ref 8.7–10.3)
Chloride: 102 mmol/L (ref 96–106)
Creatinine, Ser: 0.99 mg/dL (ref 0.57–1.00)
Glucose: 108 mg/dL — ABNORMAL HIGH (ref 70–99)
Potassium: 4.4 mmol/L (ref 3.5–5.2)
Sodium: 140 mmol/L (ref 134–144)
eGFR: 55 mL/min/1.73 — ABNORMAL LOW (ref >59)

## 2023-10-09 LAB — CBC
Hematocrit: 41.8 % (ref 34.0–46.6)
Hemoglobin: 13.3 g/dL (ref 11.1–15.9)
MCH: 30.4 pg (ref 26.6–33.0)
MCHC: 31.8 g/dL (ref 31.5–35.7)
MCV: 95 fL (ref 79–97)
Platelets: 276 x10E3/uL (ref 150–450)
RBC: 4.38 x10E6/uL (ref 3.77–5.28)
RDW: 12.9 % (ref 11.7–15.4)
WBC: 7.2 x10E3/uL (ref 3.4–10.8)

## 2023-10-12 ENCOUNTER — Ambulatory Visit: Payer: Self-pay | Admitting: Physician Assistant

## 2023-10-13 NOTE — Telephone Encounter (Signed)
 Requested Prescriptions  Pending Prescriptions Disp Refills   mirtazapine  (REMERON ) 30 MG tablet [Pharmacy Med Name: Mirtazapine  30 MG Oral Tablet] 90 tablet 1    Sig: Take 1 tablet by mouth once daily     Psychiatry: Antidepressants - mirtazapine  Passed - 10/13/2023  9:08 AM      Passed - Valid encounter within last 6 months    Recent Outpatient Visits           1 month ago Dysuria   [facility] at [facility], Leita DEL, MD   6 months ago Generalized anxiety disorder   [facility] & [facility] at [facility], Leita DEL, MD       Future Appointments             In 1 month Gollan, Timothy J, MD [facility] at [facility]

## 2023-10-21 DIAGNOSIS — F419 Anxiety disorder, unspecified: Secondary | ICD-10-CM | POA: Diagnosis not present

## 2023-10-21 DIAGNOSIS — I10 Essential (primary) hypertension: Secondary | ICD-10-CM | POA: Diagnosis not present

## 2023-10-21 DIAGNOSIS — Z6834 Body mass index (BMI) 34.0-34.9, adult: Secondary | ICD-10-CM | POA: Diagnosis not present

## 2023-10-21 DIAGNOSIS — Z7982 Long term (current) use of aspirin: Secondary | ICD-10-CM | POA: Diagnosis not present

## 2023-10-21 DIAGNOSIS — Z7983 Long term (current) use of bisphosphonates: Secondary | ICD-10-CM | POA: Diagnosis not present

## 2023-10-21 DIAGNOSIS — M5116 Intervertebral disc disorders with radiculopathy, lumbar region: Secondary | ICD-10-CM | POA: Diagnosis not present

## 2023-10-21 DIAGNOSIS — M810 Age-related osteoporosis without current pathological fracture: Secondary | ICD-10-CM | POA: Diagnosis not present

## 2023-10-21 DIAGNOSIS — Z7902 Long term (current) use of antithrombotics/antiplatelets: Secondary | ICD-10-CM | POA: Diagnosis not present

## 2023-10-21 DIAGNOSIS — E669 Obesity, unspecified: Secondary | ICD-10-CM | POA: Diagnosis not present

## 2023-10-29 ENCOUNTER — Other Ambulatory Visit: Payer: Self-pay | Admitting: Internal Medicine

## 2023-10-29 DIAGNOSIS — I10 Essential (primary) hypertension: Secondary | ICD-10-CM | POA: Diagnosis not present

## 2023-10-29 DIAGNOSIS — M5116 Intervertebral disc disorders with radiculopathy, lumbar region: Secondary | ICD-10-CM | POA: Diagnosis not present

## 2023-10-29 DIAGNOSIS — E669 Obesity, unspecified: Secondary | ICD-10-CM | POA: Diagnosis not present

## 2023-10-29 DIAGNOSIS — Z7983 Long term (current) use of bisphosphonates: Secondary | ICD-10-CM | POA: Diagnosis not present

## 2023-10-29 DIAGNOSIS — Z6834 Body mass index (BMI) 34.0-34.9, adult: Secondary | ICD-10-CM | POA: Diagnosis not present

## 2023-10-29 DIAGNOSIS — F419 Anxiety disorder, unspecified: Secondary | ICD-10-CM | POA: Diagnosis not present

## 2023-10-29 DIAGNOSIS — Z7982 Long term (current) use of aspirin: Secondary | ICD-10-CM | POA: Diagnosis not present

## 2023-10-29 DIAGNOSIS — Z7902 Long term (current) use of antithrombotics/antiplatelets: Secondary | ICD-10-CM | POA: Diagnosis not present

## 2023-10-29 DIAGNOSIS — M810 Age-related osteoporosis without current pathological fracture: Secondary | ICD-10-CM | POA: Diagnosis not present

## 2023-10-29 NOTE — Telephone Encounter (Signed)
 Requested Prescriptions  Pending Prescriptions Disp Refills   oxybutynin  (DITROPAN -XL) 10 MG 24 hr tablet [Pharmacy Med Name: Oxybutynin  Chloride ER 10 MG Oral Tablet Extended Release 24 Hour] 90 tablet 0    Sig: Take 1 tablet by mouth once daily     Urology:  Bladder Agents Passed - 10/29/2023  2:38 PM      Passed - Valid encounter within last 12 months    Recent Outpatient Visits           1 month ago Dysuria   [facility] at [facility], Leita DEL, MD   6 months ago Generalized anxiety disorder   [facility] at [facility], Leita DEL, MD       Future Appointments             In 3 weeks Gollan, Timothy J, MD [facility] at [facility]

## 2023-11-02 ENCOUNTER — Telehealth: Payer: Self-pay | Admitting: Cardiovascular Disease

## 2023-11-02 MED ORDER — NIFEDIPINE ER OSMOTIC RELEASE 30 MG PO TB24: ORAL_TABLET | ORAL | 3 refills | Status: AC

## 2023-11-02 NOTE — Telephone Encounter (Signed)
*  STAT* If patient is at the pharmacy, call can be transferred to refill team.   1. Which medications need to be refilled? (please list name of each medication and dose if known)   NIFEdipine  (PROCARDIA -XL/NIFEDICAL-XL) 30 MG 24 hr tablet    2. Which pharmacy/location (including street and city if local pharmacy) is medication to be sent to?  [id number] - [place], [place] - [address] [place] [address]      3. Do they need a 30 day or 90 day supply? 90 day supply     Pt is out of medication

## 2023-11-02 NOTE — Telephone Encounter (Signed)
 RX sent in

## 2023-11-14 ENCOUNTER — Other Ambulatory Visit: Payer: Self-pay | Admitting: Cardiovascular Disease

## 2023-11-14 ENCOUNTER — Other Ambulatory Visit: Payer: Self-pay | Admitting: Internal Medicine

## 2023-11-14 DIAGNOSIS — M810 Age-related osteoporosis without current pathological fracture: Secondary | ICD-10-CM

## 2023-11-16 NOTE — Telephone Encounter (Signed)
 Requested medications are due for refill today.  yes  Requested medications are on the active medications list.  yes  Last refill. 10/28/2022 #12 3 rf  Future visit scheduled.   A wellness visit.  Notes to clinic.  Missing labs.    Requested Prescriptions  Pending Prescriptions Disp Refills   alendronate  (FOSAMAX ) 70 MG tablet [Pharmacy Med Name: Alendronate  Sodium 70 MG Oral Tablet] 12 tablet 0    Sig: Take 1 tablet by mouth once a week     Endocrinology:  Bisphosphonates Failed - 11/16/2023  2:17 PM      Failed - Vitamin D in normal range and within 360 days    No results found for: [id number], CI6874NY7, [id number], 25OHVITD3, 25OHVITD2, 25OHVITD1, VD25OH       Failed - Phosphate in normal range and within 360 days    No results found for: PHOS       Passed - Ca in normal range and within 360 days    Calcium  Date Value Ref Range Status  10/08/2023 9.2 8.7 - 10.3 mg/dL Final         Passed - Cr in normal range and within 360 days    Creatinine, Ser  Date Value Ref Range Status  10/08/2023 0.99 0.57 - 1.00 mg/dL Final         Passed - Mg Level in normal range and within 360 days    Magnesium   Date Value Ref Range Status  08/19/2023 1.6 1.6 - 2.3 mg/dL Final         Passed - eGFR is 30 or above and within 360 days    GFR calc Af Amer  Date Value Ref Range Status  01/22/2016 >60 >60 mL/min Final    Comment:    (NOTE) The eGFR has been calculated using the CKD EPI equation. This calculation has not been validated in all clinical situations. eGFR's persistently <60 mL/min signify possible Chronic Kidney Disease.    GFR, Estimated  Date Value Ref Range Status  09/25/2023 >60 >60 mL/min Final    Comment:    (NOTE) Calculated using the CKD-EPI Creatinine Equation (2021)    eGFR  Date Value Ref Range Status  10/08/2023 55 (L) >59 mL/min/1.73 Final         Passed - Valid encounter within last 12 months    Recent Outpatient Visits            2 months ago Dysuria   [place] [facility], Leita DEL, MD   7 months ago Generalized anxiety disorder   [facility] at [facility], Leita DEL, MD       Future Appointments             In 1 week Gollan, Timothy J, MD [facility] Density or Dexa Scan completed in the last 2 years       oxybutynin  (DITROPAN -XL) 10 MG 24 hr tablet [Pharmacy Med Name: Oxybutynin  Chloride ER 10 MG Oral Tablet Extended Release 24 Hour] 90 tablet 0    Sig: Take 1 tablet by mouth once daily     Urology:  Bladder Agents Passed - 11/16/2023  2:17 PM      Passed - Valid encounter within last 12 months    Recent Outpatient Visits  2 months ago Dysuria   [facility] at [facility], Leita DEL, MD   7 months ago Generalized anxiety disorder   [facility] & [facility] Medicine at [facility], Leita DEL, MD       Future Appointments             In 1 week Gollan, Timothy J, MD [facility] at [facility]

## 2023-11-19 ENCOUNTER — Ambulatory Visit: Payer: Medicare HMO

## 2023-11-19 DIAGNOSIS — I495 Sick sinus syndrome: Secondary | ICD-10-CM | POA: Diagnosis not present

## 2023-11-23 LAB — CUP PACEART REMOTE DEVICE CHECK
Battery Remaining Longevity: 18 mo
Battery Remaining Percentage: 34 %
Brady Statistic RA Percent Paced: 86 %
Brady Statistic RV Percent Paced: 4 %
Date Time Interrogation Session: 20250929065200
Implantable Lead Connection Status: 753985
Implantable Lead Connection Status: 753985
Implantable Lead Implant Date: 20180913
Implantable Lead Implant Date: 20180913
Implantable Lead Location: 753859
Implantable Lead Location: 753860
Implantable Lead Model: 4473
Implantable Lead Model: 7742
Implantable Lead Serial Number: 497240
Implantable Lead Serial Number: 841204
Implantable Pulse Generator Implant Date: 20180913
Lead Channel Impedance Value: 375 Ohm
Lead Channel Impedance Value: 826 Ohm
Lead Channel Pacing Threshold Amplitude: 0.4 V
Lead Channel Pacing Threshold Amplitude: 1 V
Lead Channel Pacing Threshold Pulse Width: 0.4 ms
Lead Channel Pacing Threshold Pulse Width: 0.4 ms
Lead Channel Setting Pacing Amplitude: 2 V
Lead Channel Setting Pacing Amplitude: 2 V
Lead Channel Setting Pacing Pulse Width: 0.4 ms
Lead Channel Setting Sensing Sensitivity: 2.5 mV
Pulse Gen Serial Number: 374129
Zone Setting Status: 755011

## 2023-11-23 NOTE — Progress Notes (Unsigned)
 Cardiology Office Note  Date:  11/24/2023   ID:  Kenni, Newton [birth year], MRN [id number]  PCP:  Justus Leita DEL, MD   Chief Complaint  Patient presents with   1 month follow up     Denies chest pain or shortness of breath.      HPI:  [name] is a 89 year old woman with past medical history of Back pain, anxiety, Hypertension,  hyperlipidemia DVT in summer 2022,  CAD,  s/p stents to proximal LAD and mid RCA, 2013  Hx of PVCs and PACs.  Tachybradycardia syndrome DDDR permanent pacemaker [employer] 11/07/2016  Large hiatal hernia that causes nausea, vomiting, chest pain Ejection fraction 55 to 60% in September 2024 Who presents for follow-up of her tachybradycardia syndrome, coronary artery disease  Last seen by myself in clinic January 2024 Seen by one of our providers October 08, 2023 Followed by EP for pacer checks Had pacer update today Download reviewed, minimal atrial fibrillation  Lives with daughter  [facility] ED with central chest heaviness on 09/25/2023.  1 hour then resolved.  Workup  negative.  admitted to [facility] 09/28/2023 through 09/30/2023 for atypical chest pain.  She was thought to have HFpEF exacerbation with proBNP elevated at 1221 and chest x-ray showing pulmonary vascular congestion.  She was diuresed with IV Lasix .  Cardiology was consulted and patient's chest discomfort was not thought to be ischemic in origin.   In follow-up today reports she continues to have difficulty with her stomach, has to be selective with what she eats.  attributes symptoms to her hiatal hernia Feel she is too old for surgery  Denies significant shortness of breath, no lower extremity edema  She denies significant tachycardia concerning for arrhythmia  Notes from [facility],  1) Tachy-Bradycardia/SVT/NSVT A. 7-day monitor 10/23-10/30/17: showing frequent atrial and ventricular ectopy, SVT and NSVT, with an average HR of 69, low 50, high 179, 15%  PACs, 2.4% PVCs B. EKG at primary cardiology appointment showing NSR with PACs, HR 63  C. Treatment with Sotalol  D. Event Monitor 6/4-08/26/2016: NSR with PAF, HR 40-119 bpm, Average 60 bpm E. Continued symptoms of palpitations on low dose Sotalol , also with bradycardia on low dose Sotalol  F. implantation of a DDDR permanent pacemaker [employer] 11/07/2016   2) SVT/PAC/PVC, started on sotalol  at prior cardiology office   3) Coronary artery disease  A. History of stents to proximal LAD and mid RCA  B. Transthoracic echocardiogram 10/2014 showing EF 60-65% with no significant valvular disease  C. Left heart catheterization 03/27/15 showing mild to moderate CAD with patent stents noted to the proximal LAD and mid RCA, EF 60%  D. Transthoracic echocardiogram 10/08/15 showing EF 70-75% and no significant valvular disease  E. Negative stress test 09/2015 - data deficient  F. LHC 01/09/16: reported no change from [facility] in January G. LHC 06/2016: mild CAD, patent stents in prox LAD and mid large dominant RCA.  Manual Device Interrogation: July 2023  6 [facility] DDDR, Normal Function. 4 years on battery. 76% RA paced 3% burden, 4% AT at 170-180 bpm   Most recent left heart catheterization by Dr. Lander  demonstrated nonobstructive CAD and elevated left heart pressures. Patent stents, She was placed on daily Lasix     PMH:   has a past medical history of CAD (coronary artery disease), Hyperlipidemia, Hypertension, Hypothyroidism, IBS (irritable bowel syndrome), Osteoarthritis, PAF (paroxysmal atrial fibrillation) (HCC), PVD (peripheral vascular disease), SVT (supraventricular tachycardia), and Tachy-brady syndrome (HCC).  PSH:  Past Surgical History:  Procedure Laterality Date   ABDOMINAL HYSTERECTOMY     ANTERIOR FUSION CERVICAL SPINE     twice   CHOLECYSTECTOMY     CORONARY ANGIOPLASTY WITH STENT PLACEMENT     proximal LAD and mid RCA   LUMBAR DISC SURGERY     Pace maker  11/06/2016   [employer]  [place] Sciet[employer] Pacemaker Model L311/ Serial [id number]   THYROIDECTOMY Left     Current Outpatient Medications  Medication Sig Dispense Refill   Acetaminophen (TYLENOL PO) Take by mouth as needed.     alendronate  (FOSAMAX ) 70 MG tablet Take 1 tablet by mouth once a week 12 tablet 0   Aspirin 81 MG CAPS Take 1 tablet by mouth daily.     Calcium 200 MG TABS Take 600 mg by mouth 2 (two) times daily.     Cholecalciferol (VITAMIN D3) 250 MCG (10000 UT) CAPS Take by mouth.     clopidogrel  (PLAVIX ) 75 MG tablet Take 1 tablet (75 mg total) by mouth daily. 90 tablet 3   esomeprazole (NEXIUM) 20 MG capsule Take 20 mg by mouth daily at 12 noon.     levothyroxine  (SYNTHROID ) 50 MCG tablet TAKE 1 TABLET BY MOUTH ONCE DAILY BEFORE BREAKFAST 90 tablet 0   LORazepam  (ATIVAN ) 0.5 MG tablet Take 1 tablet (0.5 mg total) by mouth 2 (two) times daily as needed. for anxiety 60 tablet 3   mirtazapine  (REMERON ) 30 MG tablet Take 1 tablet by mouth once daily 90 tablet 1   NIFEdipine  (PROCARDIA -XL/NIFEDICAL-XL) 30 MG 24 hr tablet TAKE ONE TABLET (30 MG) BY MOUTH ONCE DAILY. 90 tablet 3   nitroGLYCERIN (NITROSTAT) 0.4 MG SL tablet Place under the tongue.     oxybutynin  (DITROPAN -XL) 10 MG 24 hr tablet Take 1 tablet by mouth once daily 90 tablet 0   pantoprazole  (PROTONIX ) 40 MG tablet Take 1 tablet (40 mg total) by mouth 2 (two) times daily. 180 tablet 1   pravastatin  (PRAVACHOL ) 40 MG tablet Take 1 tablet by mouth once daily 90 tablet 0   sacubitril-valsartan (ENTRESTO ) 24-26 MG Take 1 tablet by mouth twice daily 180 tablet 3   SOTALOL  AF 80 MG TABS Take 1 tablet by mouth twice daily 180 tablet 3   spironolactone  (ALDACTONE ) 25 MG tablet Take 0.5 tablets (12.5 mg total) by mouth daily. 45 tablet 3   traMADol (ULTRAM) 50 MG tablet Take 50 mg by mouth every 6 (six) hours as needed.     gabapentin  (NEURONTIN ) 100 MG capsule Take 1 capsule by mouth at bedtime (Patient not taking: Reported on 11/24/2023) 90 capsule 0   No  current facility-administered medications for this visit.    Allergies:   Penicillins, Sulfur dioxide, Other, and Sulfa antibiotics   Social History:  The patient  reports that she has never smoked. She has never used smokeless tobacco. She reports that she does not drink alcohol and does not use drugs.   Family History:   family history includes Cancer in her mother; Diabetes in her sister; Hearing loss in her father; Heart attack in her father; Heart disease in her brother and sister; Hypertension in her sister; Liver cancer in her sister; Lung disease in her brother; Pancreatic cancer in her sister.    Review of Systems: Review of Systems  Constitutional: Negative.   HENT: Negative.    Respiratory: Negative.    Cardiovascular: Negative.   Gastrointestinal: Negative.   Musculoskeletal: Negative.   Neurological: Negative.   Psychiatric/Behavioral: Negative.  All other systems reviewed and are negative.   PHYSICAL EXAM: VS:  BP 128/70 (BP Location: Left Arm, Patient Position: Sitting, Cuff Size: Normal)   Pulse 73   Ht 5' (1.524 m)   Wt 174 lb 6 oz (79.1 kg)   SpO2 92%   BMI 34.06 kg/m  , BMI Body mass index is 34.06 kg/m. Constitutional:  oriented to person, place, and time. No distress.  HENT:  Head: Grossly normal Eyes:  no discharge. No scleral icterus.  Neck: No JVD, no carotid bruits  Cardiovascular: Regular rate and rhythm, no murmurs appreciated Pulmonary/Chest: Clear to auscultation bilaterally, no wheezes or rails Abdominal: Soft.  no distension.  no tenderness.  Musculoskeletal: Normal range of motion Neurological:  normal muscle tone. Coordination normal. No atrophy Skin: Skin warm and dry Psychiatric: normal affect, pleasant  Recent Labs: 08/19/2023: Magnesium  1.6 10/08/2023: BUN 7; Creatinine, Ser 0.99; Hemoglobin 13.3; Platelets 276; Potassium 4.4; Sodium 140    Lipid Panel Lab Results  Component Value Date   CHOL 173 10/08/2022   HDL 52 10/08/2022    LDLCALC 85 10/08/2022   TRIG 219 (H) 10/08/2022    Wt Readings from Last 3 Encounters:  11/24/23 174 lb 6 oz (79.1 kg)  10/08/23 172 lb (78 kg)  09/25/23 170 lb (77.1 kg)     ASSESSMENT AND PLAN:  Problem List Items Addressed This Visit       Cardiology Problems   Mixed hyperlipidemia (Chronic)   PVCs (premature ventricular contractions) (Chronic)   PVD (peripheral vascular disease) (Chronic)   Tachy-brady syndrome (HCC) - Primary (Chronic)   Essential hypertension   Other Visit Diagnoses       Coronary artery disease involving native coronary artery of native heart without angina pectoris         Hyperlipidemia LDL goal <70         Diastolic dysfunction         Paroxysmal A-fib (HCC)         Pacemaker           Tachybradycardia syndrome Reports she is doing well on sotalol  80 twice daily,  Rare short episodes of atrial tachycardia/A-fib on pacer download today  - Pacemaker update today Followed by EP, no changes made  Coronary disease with stable angina Currently with no symptoms of angina. No further workup at this time. Continue current medication regimen.  No recent intervention -Recommend she can hold aspirin, stay on Plavix   Atypical chest pain Large hiatal hernia, history of nausea and vomiting - Has to be very careful with her diet, food is sticking, has to vomit when it gets caught  Pacemaker Tachybradycardia syndrome  DDDR permanent pacemaker [employer] 11/07/2016  Software update today, followed by EP  Hyperlipidemia Continue pravastatin , Total cholesterol 143 LDL 68   Signed, Velinda Lunger, M.D., Ph.D. [facility] [place], [place] [phone]

## 2023-11-24 ENCOUNTER — Ambulatory Visit: Attending: Cardiovascular Disease | Admitting: Cardiovascular Disease

## 2023-11-24 ENCOUNTER — Encounter: Payer: Self-pay | Admitting: Cardiovascular Disease

## 2023-11-24 VITALS — BP 128/70 | HR 73 | Ht 60.0 in | Wt 174.4 lb

## 2023-11-24 DIAGNOSIS — I739 Peripheral vascular disease, unspecified: Secondary | ICD-10-CM

## 2023-11-24 DIAGNOSIS — I2510 Atherosclerotic heart disease of native coronary artery without angina pectoris: Secondary | ICD-10-CM

## 2023-11-24 DIAGNOSIS — I5189 Other ill-defined heart diseases: Secondary | ICD-10-CM

## 2023-11-24 DIAGNOSIS — I493 Ventricular premature depolarization: Secondary | ICD-10-CM | POA: Diagnosis not present

## 2023-11-24 DIAGNOSIS — Z950 Presence of cardiac pacemaker: Secondary | ICD-10-CM | POA: Diagnosis not present

## 2023-11-24 DIAGNOSIS — I10 Essential (primary) hypertension: Secondary | ICD-10-CM

## 2023-11-24 DIAGNOSIS — E782 Mixed hyperlipidemia: Secondary | ICD-10-CM | POA: Diagnosis not present

## 2023-11-24 DIAGNOSIS — I495 Sick sinus syndrome: Secondary | ICD-10-CM

## 2023-11-24 DIAGNOSIS — E785 Hyperlipidemia, unspecified: Secondary | ICD-10-CM | POA: Diagnosis not present

## 2023-11-24 DIAGNOSIS — I480 Paroxysmal atrial fibrillation: Secondary | ICD-10-CM

## 2023-11-24 MED ORDER — PRAVASTATIN SODIUM 40 MG PO TABS: 40.0000 mg | ORAL_TABLET | Freq: Every day | ORAL | 3 refills | Status: AC

## 2023-11-24 NOTE — Patient Instructions (Addendum)
 Medication Instructions:   Stop aspirin  If you need a refill on your cardiac medications before your next appointment, please call your pharmacy.   Lab work: No new labs needed  Testing/Procedures: No new testing needed  Follow-Up: At [facility], you and your health needs are our priority.  As part of our continuing mission to provide you with exceptional heart care, we have created designated Provider Care Teams.  These Care Teams include your primary Cardiologist (physician) and Advanced Practice Providers (APPs -  Physician Assistants and Nurse Practitioners) who all work together to provide you with the care you need, when you need it.  You will need a follow up appointment in 12 months  Providers on your designated Care Team:   Lonni Meager, NP Bernardino Bring, PA-C Cadence [place], [place]  COVID-19 Vaccine Information can be found at: [email or web address] For questions related to vaccine distribution or appointments, please email [email or web address] or call [phone].

## 2023-11-25 NOTE — Progress Notes (Signed)
 Remote PPM Transmission

## 2023-11-26 ENCOUNTER — Encounter: Payer: Self-pay | Admitting: Cardiology

## 2023-11-27 ENCOUNTER — Ambulatory Visit: Payer: Self-pay | Admitting: Cardiology

## 2023-11-30 ENCOUNTER — Ambulatory Visit: Payer: Self-pay | Admitting: Cardiovascular Disease

## 2023-12-01 ENCOUNTER — Other Ambulatory Visit: Payer: Self-pay | Admitting: Internal Medicine

## 2023-12-01 DIAGNOSIS — K219 Gastro-esophageal reflux disease without esophagitis: Secondary | ICD-10-CM

## 2023-12-02 NOTE — Telephone Encounter (Signed)
 Requested Prescriptions  Pending Prescriptions Disp Refills   pantoprazole  (PROTONIX ) 40 MG tablet [Pharmacy Med Name: Pantoprazole  Sodium 40 MG Oral Tablet Delayed Release] 180 tablet 0    Sig: Take 1 tablet by mouth twice daily     Gastroenterology: Proton Pump Inhibitors Passed - 12/02/2023  3:26 PM      Passed - Valid encounter within last 12 months    Recent Outpatient Visits           2 months ago Dysuria   [facility] at [facility], Leita DEL, MD   7 months ago Generalized anxiety disorder   [facility] at [facility], Leita DEL, MD

## 2023-12-10 ENCOUNTER — Other Ambulatory Visit: Payer: Self-pay | Admitting: Internal Medicine

## 2023-12-11 ENCOUNTER — Ambulatory Visit
Admission: EM | Admit: 2023-12-11 | Discharge: 2023-12-11 | Disposition: A | Discharge status: Home / Self Care | Attending: Emergency Medicine | Admitting: Emergency Medicine

## 2023-12-11 ENCOUNTER — Encounter: Payer: Self-pay | Admitting: Emergency Medicine

## 2023-12-11 DIAGNOSIS — J069 Acute upper respiratory infection, unspecified: Secondary | ICD-10-CM | POA: Insufficient documentation

## 2023-12-11 LAB — RESP PANEL BY RT-PCR (RSV, FLU A&B, COVID)  RVPGX2
Influenza A by PCR: NEGATIVE
Influenza B by PCR: NEGATIVE
Resp Syncytial Virus by PCR: NEGATIVE
SARS Coronavirus 2 by RT PCR: NEGATIVE

## 2023-12-11 MED ORDER — IPRATROPIUM BROMIDE 0.06 % NA SOLN: 2.0000 | Freq: Four times a day (QID) | NASAL | 12 refills | Status: DC

## 2023-12-11 NOTE — Discharge Instructions (Addendum)
 As we discussed, I do feel that you are experiencing a respiratory virus.  Viruses typically run their course in 7 to 10 days.  Use over-the-counter Tylenol and or ibuprofen according to the package instructions as needed for any pain or if you develop a fever.  Use the Atrovent nasal spray, 2 squirts up each nostril every 6 hours as needed for nasal congestion, runny nose, or postnasal drip.  If you develop any new or worsening symptoms either return for reevaluation or follow-up with your primary care provider.

## 2023-12-11 NOTE — ED Triage Notes (Signed)
 Patient c/o bodyaches, nasal congestion and headache that started last Saturday.  Patient states that she got a flu shot over a week ago.  Patient unsure of fevers.

## 2023-12-11 NOTE — Telephone Encounter (Signed)
 Requested Prescriptions  Pending Prescriptions Disp Refills   levothyroxine  (SYNTHROID ) 50 MCG tablet [Pharmacy Med Name: Levothyroxine  Sodium 50 MCG Oral Tablet] 90 tablet 0    Sig: TAKE 1 TABLET BY MOUTH ONCE DAILY BEFORE BREAKFAST     Endocrinology:  Hypothyroid Agents Failed - 12/11/2023  2:39 PM      Failed - TSH in normal range and within 360 days    TSH  Date Value Ref Range Status  10/08/2022 1.370 0.450 - 4.500 uIU/mL Final         Passed - Valid encounter within last 12 months    Recent Outpatient Visits           3 months ago Dysuria   [facility] at [facility], Leita DEL, MD   8 months ago Generalized anxiety disorder   [facility] at [facility], Leita DEL, MD

## 2023-12-11 NOTE — ED Provider Notes (Addendum)
 [facility]    CSN: [id number] Arrival date & time: 12/11/23  1258      History   Chief Complaint Chief Complaint  Patient presents with   Headache   Generalized Body Aches    HPI Sandra Murphy is a 89 y.o. female.   HPI  89 year old female with past medical history significant for CAD, hyperlipidemia, hypertension, hypothyroidism, IBS, atrial fibrillation, PVD, and osteoarthritis presents for evaluation of nasal congestion, headache, and bodyaches that began 6 days ago.  She also reports some right ear pain that began this morning.  She has an infrequent, nonproductive cough.  Recent travel to state or country.  No nasal discharge.  Past Medical History:  Diagnosis Date   CAD (coronary artery disease)    stents to proximal LAD and mid RCA   Hyperlipidemia    Hypertension    Hypothyroidism    IBS (irritable bowel syndrome)    Osteoarthritis    PAF (paroxysmal atrial fibrillation) (HCC)    a. Subclinical - detected on device monitoring-->sotalol .  No OAC given low-burden.   PVD (peripheral vascular disease)    SVT (supraventricular tachycardia)    Tachy-brady syndrome (HCC)    a. 10/2016 s/p BSX L311 Accolade MRI DC PPM.    Patient Active Problem List   Diagnosis Date Noted   Essential hypertension 10/08/2022   Tachy-brady syndrome (HCC) 12/04/2021   GERD without esophagitis 12/04/2021   Generalized anxiety disorder 12/04/2021   Degenerative disc disease, lumbar 12/04/2021   OAB (overactive bladder) 12/04/2021   Acquired hypothyroidism 12/04/2021   Mixed hyperlipidemia 08/08/2021   Hx of deep venous thrombosis 08/08/2021   Osteoporosis 08/08/2021   PVD (peripheral vascular disease) 08/08/2021   Hx of heart artery stent 08/08/2021   Hiatal hernia 08/08/2021   Asthma 11/14/2020   Atherosclerotic heart disease of native coronary artery without angina pectoris 01/10/2016   PVCs (premature ventricular contractions) 01/10/2016    Past Surgical History:   Procedure Laterality Date   ABDOMINAL HYSTERECTOMY     ANTERIOR FUSION CERVICAL SPINE     twice   CHOLECYSTECTOMY     CORONARY ANGIOPLASTY WITH STENT PLACEMENT     proximal LAD and mid RCA   LUMBAR DISC SURGERY     Pace maker  11/06/2016   [employer]onic [place] Scietific Pacemaker Model L311/ Serial [id number]   THYROIDECTOMY Left     OB History   No obstetric history on file.      Home Medications    Prior to Admission medications   Medication Sig Start Date End Date Taking? Authorizing Provider  ipratropium (ATROVENT) 0.06 % nasal spray Place 2 sprays into both nostrils 4 (four) times daily. 12/11/23  Yes Bernardino Ditch, NP  Acetaminophen (TYLENOL PO) Take by mouth as needed.    [provider]  alendronate  (FOSAMAX ) 70 MG tablet Take 1 tablet by mouth once a week 11/16/23   Justus Leita DEL, MD  Calcium 200 MG TABS Take 600 mg by mouth 2 (two) times daily.    [provider]  Cholecalciferol (VITAMIN D3) 250 MCG (10000 UT) CAPS Take by mouth.    [provider]  clopidogrel  (PLAVIX ) 75 MG tablet Take 1 tablet (75 mg total) by mouth daily. 11/16/23   Lorene Lesley LITTIE, PA-C  esomeprazole (NEXIUM) 20 MG capsule Take 20 mg by mouth daily at 12 noon.    [provider]  gabapentin  (NEURONTIN ) 100 MG capsule Take 1 capsule by mouth at bedtime Patient not taking: Reported  on 11/24/2023 05/06/23   Justus Leita DEL, MD  levothyroxine  (SYNTHROID ) 50 MCG tablet TAKE 1 TABLET BY MOUTH ONCE DAILY BEFORE BREAKFAST 09/15/23   Justus Leita DEL, MD  LORazepam  (ATIVAN ) 0.5 MG tablet Take 1 tablet (0.5 mg total) by mouth 2 (two) times daily as needed. for anxiety 09/07/23   Justus Leita DEL, MD  mirtazapine  (REMERON ) 30 MG tablet Take 1 tablet by mouth once daily 10/13/23   Berglund, Laura H, MD  NIFEdipine  (PROCARDIA -XL/NIFEDICAL-XL) 30 MG 24 hr tablet TAKE ONE TABLET (30 MG) BY MOUTH ONCE DAILY. 11/02/23   Gollan, Timothy J, MD  nitroGLYCERIN (NITROSTAT) 0.4 MG SL tablet  Place under the tongue.    [provider]  oxybutynin  (DITROPAN -XL) 10 MG 24 hr tablet Take 1 tablet by mouth once daily 11/16/23   Berglund, Laura H, MD  pantoprazole  (PROTONIX ) 40 MG tablet Take 1 tablet by mouth twice daily 12/02/23   Berglund, Laura H, MD  pravastatin  (PRAVACHOL ) 40 MG tablet Take 1 tablet (40 mg total) by mouth daily. 11/24/23   Gollan, Timothy J, MD  sacubitril-valsartan (ENTRESTO ) 24-26 MG Take 1 tablet by mouth twice daily 04/21/23   Gollan, Timothy J, MD  SOTALOL  AF 80 MG TABS Take 1 tablet by mouth twice daily 05/22/23   Gollan, Timothy J, MD  spironolactone  (ALDACTONE ) 25 MG tablet Take 0.5 tablets (12.5 mg total) by mouth daily. 10/08/23 10/07/24  Lorene Lesley CROME, PA-C  traMADol (ULTRAM) 50 MG tablet Take 50 mg by mouth every 6 (six) hours as needed.    [provider]    Family History Family History  Problem Relation Age of Onset   Cancer Mother    Hearing loss Father    Heart attack Father    Hypertension Sister    Heart disease Sister    Diabetes Sister    Pancreatic cancer Sister    Liver cancer Sister    Heart disease Brother    Lung disease Brother     Social History Social History   Tobacco Use   Smoking status: Never   Smokeless tobacco: Never  Vaping Use   Vaping status: Never Used  Substance Use Topics   Alcohol use: No   Drug use: Never     Allergies   Penicillins, Sulfur dioxide, Other, and Sulfa antibiotics   Review of Systems Review of Systems  Constitutional:  Positive for fever.  HENT:  Positive for congestion and ear pain. Negative for rhinorrhea and sore throat.   Respiratory:  Positive for cough. Negative for shortness of breath and wheezing.   Musculoskeletal:  Positive for arthralgias and myalgias.  Neurological:  Positive for headaches.     Physical Exam Triage Vital Signs ED Triage Vitals  Encounter Vitals Group     BP      Girls Systolic BP Percentile      Girls Diastolic BP Percentile       Boys Systolic BP Percentile      Boys Diastolic BP Percentile      Pulse      Resp      Temp      Temp src      SpO2      Weight      Height      Head Circumference      Peak Flow      Pain Score      Pain Loc      Pain Education      Exclude from [employer]  Chart    No data found.  Updated Vital Signs BP 120/80 (BP Location: Left Arm)   Pulse 71   Temp 97.6 F (36.4 C) (Temporal)   Resp 14   Ht 5' (1.524 m)   Wt 174 lb 6.1 oz (79.1 kg)   SpO2 97%   BMI 34.06 kg/m   Visual Acuity Right Eye Distance:   Left Eye Distance:   Bilateral Distance:    Right Eye Near:   Left Eye Near:    Bilateral Near:     Physical Exam Vitals and nursing note reviewed.  Constitutional:      Appearance: Normal appearance. She is not ill-appearing.  HENT:     Head: Normocephalic and atraumatic.     Right Ear: Tympanic membrane, ear canal and external ear normal. There is no impacted cerumen.     Left Ear: Tympanic membrane, ear canal and external ear normal. There is no impacted cerumen.     Nose: Congestion and rhinorrhea present.     Comments: Raoul is mildly edematous but free of erythema.  Scant clear discharge present in both nares.    Mouth/Throat:     Mouth: Mucous membranes are moist.     Pharynx: Oropharynx is clear. No oropharyngeal exudate or posterior oropharyngeal erythema.  Cardiovascular:     Rate and Rhythm: Normal rate and regular rhythm.     Pulses: Normal pulses.     Heart sounds: Normal heart sounds. No murmur heard.    No friction rub. No gallop.  Pulmonary:     Effort: Pulmonary effort is normal.     Breath sounds: Normal breath sounds. No wheezing, rhonchi or rales.  Musculoskeletal:     Cervical back: Normal range of motion and neck supple. No tenderness.  Lymphadenopathy:     Cervical: No cervical adenopathy.  Skin:    General: Skin is warm and dry.     Capillary Refill: Capillary refill takes less than 2 seconds.     Findings: No rash.  Neurological:      General: No focal deficit present.     Mental Status: She is alert and oriented to person, place, and time.      UC Treatments / Results  Labs (all labs ordered are listed, but only abnormal results are displayed) Labs Reviewed  RESP PANEL BY RT-PCR (RSV, FLU A&B, COVID)  [id number]    EKG   Radiology No results found.  Procedures Procedures (including critical care time)  Medications Ordered in UC Medications - No data to display  Initial Impression / Assessment and Plan / UC Course  I have reviewed the triage vital signs and the nursing notes.  Pertinent labs & imaging results that were available during my care of the patient were reviewed by me and considered in my medical decision making (see chart for details).   Patient is a nontoxic-appearing 89 year old female presenting for evaluation of 6 days worth of respiratory symptoms as outlined HPI above.  In the exam room she is not in any acute distress.  She is able to speak in full sentence without dyspnea or tachypnea.  Respiratory rate at triage was 14 with a 97% room air oxygen saturation.  She is primary complaining of nasal congestion but does not endorse any nasal discharge.  When asked about fever she says that she has had some off-and-on fevers that are subjective but she has not measured a fever.  She is currently afebrile here with an oral temp of 97.6.  Her physical exam reveals mild inflammation of her upper respiratory tract without purulent discharge.  Her cardiopulmonary exam is benign.  I suspect she has a respiratory virus.  She does report that her symptoms began after she received her flu shot.  I will discharge her home with some Atrovent nasal spray to help with nasal congestion and have her use over-the-counter Tylenol and/or ibuprofen according to the package instructions as needed for any fever or bodyaches.  Return precautions reviewed.  Respiratory panel was collected at triage is negative for COVID,  influenza, and RSV.   Final Clinical Impressions(s) / UC Diagnoses   Final diagnoses:  Viral upper respiratory tract infection     Discharge Instructions      As we discussed, I do feel that you are experiencing a respiratory virus.  Viruses typically run their course in 7 to 10 days.  Use over-the-counter Tylenol and or ibuprofen according to the package instructions as needed for any pain or if you develop a fever.  Use the Atrovent nasal spray, 2 squirts up each nostril every 6 hours as needed for nasal congestion, runny nose, or postnasal drip.  If you develop any new or worsening symptoms either return for reevaluation or follow-up with your primary care provider.     ED Prescriptions     Medication Sig Dispense Auth. Provider   ipratropium (ATROVENT) 0.06 % nasal spray Place 2 sprays into both nostrils 4 (four) times daily. 15 mL Bernardino Ditch, NP      PDMP not reviewed this encounter.   Bernardino Ditch, NP 12/11/23 1341    Bernardino Ditch, NP 12/11/23 1407

## 2023-12-14 ENCOUNTER — Ambulatory Visit (INDEPENDENT_AMBULATORY_CARE_PROVIDER_SITE_OTHER): Admitting: Internal Medicine

## 2023-12-14 ENCOUNTER — Other Ambulatory Visit: Payer: Self-pay | Admitting: Internal Medicine

## 2023-12-14 ENCOUNTER — Encounter: Payer: Self-pay | Admitting: Internal Medicine

## 2023-12-14 ENCOUNTER — Ambulatory Visit
Admission: RE | Admit: 2023-12-14 | Discharge: 2023-12-14 | Disposition: A | Source: Ambulatory Visit | Attending: Internal Medicine | Admitting: Internal Medicine

## 2023-12-14 ENCOUNTER — Ambulatory Visit
Admission: RE | Admit: 2023-12-14 | Discharge: 2023-12-14 | Disposition: A | Discharge status: Home / Self Care | Attending: Internal Medicine | Admitting: Internal Medicine

## 2023-12-14 ENCOUNTER — Ambulatory Visit: Payer: Self-pay

## 2023-12-14 ENCOUNTER — Ambulatory Visit: Payer: Self-pay | Admitting: Internal Medicine

## 2023-12-14 VITALS — BP 122/84 | HR 70 | Ht 60.0 in | Wt 170.0 lb

## 2023-12-14 DIAGNOSIS — R6883 Chills (without fever): Secondary | ICD-10-CM

## 2023-12-14 DIAGNOSIS — R059 Cough, unspecified: Secondary | ICD-10-CM | POA: Diagnosis not present

## 2023-12-14 DIAGNOSIS — R0981 Nasal congestion: Secondary | ICD-10-CM | POA: Diagnosis not present

## 2023-12-14 DIAGNOSIS — R0781 Pleurodynia: Secondary | ICD-10-CM | POA: Diagnosis not present

## 2023-12-14 DIAGNOSIS — K449 Diaphragmatic hernia without obstruction or gangrene: Secondary | ICD-10-CM | POA: Diagnosis not present

## 2023-12-14 LAB — POCT URINALYSIS DIPSTICK
Bilirubin, UA: NEGATIVE
Blood, UA: NEGATIVE
Glucose, UA: NEGATIVE
Ketones, UA: NEGATIVE
Nitrite, UA: NEGATIVE
Protein, UA: NEGATIVE
Spec Grav, UA: 1.025 (ref 1.010–1.025)
Urobilinogen, UA: 0.2 U/dL
pH, UA: 6 (ref 5.0–8.0)

## 2023-12-14 MED ORDER — PREDNISONE 10 MG PO TABS: ORAL_TABLET | ORAL | 0 refills | Status: AC

## 2023-12-14 NOTE — Telephone Encounter (Signed)
 Patient's daughter called to reports patient is having continual symptoms of cough, body aches, and generally not feeling well. Patient was dx with URI on 12/11/2023 at [facility]. Patient told daughter she wanted to be seen by PCP today. Scheduled for acute visit with PCP today at 1:40 PM  FYI Only or Action Required?: FYI only for provider.  Patient was last seen in primary care on 09/07/2023 by Justus Leita DEL, MD.  Called Nurse Triage reporting Cough.  Symptoms began several days ago.  Interventions attempted: Rest, hydration, or home remedies.  Symptoms are: unchanged.  Triage Disposition: See HCP Within 4 Hours (Or PCP Triage)  Patient/caregiver understands and will follow disposition?: Yes  Copied from CRM #[id number]. Topic: Clinical - Red Word Triage >> Dec 14, 2023  9:53 AM Winona R wrote: Decision tree denial: worst symptoms. Pt received Flu shot on 10/11 and started to feel unwell on 10/13 pt was then told she has a respiratory virus about 4 days later. To day the pt still feels bad and really wants to see a doctor. Body aches, congestions, cough, general feeling of feeling un well Reason for Disposition  [1] MILD difficulty breathing (e.g., minimal/no SOB at rest, SOB with walking, pulse < 100) AND [2] still present when not coughing  Answer Assessment - Initial Assessment Questions 1. ONSET: When did the cough begin?      Symptoms started on 12/07/2023 2. SEVERITY: How bad is the cough today?      Daughter is unsure of the severity 3. SPUTUM: Describe the color of your sputum (e.g., none, dry cough; clear, white, yellow, green)     dry 4. HEMOPTYSIS: Are you coughing up any blood? If Yes, ask: How much? (e.g., flecks, streaks, tablespoons, etc.)     no 5. DIFFICULTY BREATHING: Are you having difficulty breathing? If Yes, ask: How bad is it? (e.g., mild, moderate, severe)      mild 6. FEVER: Do you have a fever? If Yes, ask: What is your temperature, how was it  measured, and when did it start?     no 7. CARDIAC HISTORY: Do you have any history of heart disease? (e.g., heart attack, congestive heart failure)      no 8. LUNG HISTORY: Do you have any history of lung disease?  (e.g., pulmonary embolus, asthma, emphysema)     no 9. PE RISK FACTORS: Do you have a history of blood clots? (or: recent major surgery, recent prolonged travel, bedridden)     unsure 10. OTHER SYMPTOMS: Do you have any other symptoms? (e.g., runny nose, wheezing, chest pain)       Body aches, congestion, generally not feeling well 12. TRAVEL: Have you traveled out of the country in the last month? (e.g., travel history, exposures)       no  Protocols used: Cough - Acute Non-Productive-A-AH

## 2023-12-14 NOTE — Addendum Note (Signed)
 Addended by: CORALEE STEVA SAILOR on: 12/14/2023 02:28 PM   Modules accepted: Orders

## 2023-12-14 NOTE — Progress Notes (Signed)
 Date:  12/14/2023   Name:  Sandra Murphy   DOB:  [birth year]   MRN:  [id number]   Chief Complaint: Cough (Went to UC on Friday,10/17)  Cough This is a recurrent problem. Episode onset: X1 week. Progression since onset: gets better then gets worse. Episode frequency: comes and goes. The cough is Productive of sputum (clear). Associated symptoms include chills, ear pain, myalgias, nasal congestion, postnasal drip, rhinorrhea and shortness of breath. Treatments tried: nasal spray, tylenol. The treatment provided no relief. Her past medical history is significant for asthma.  Chills/fever/diarrhea - still having chills and subjective fever, one day of diarrhea over the weekend.  Not coughing up or blowing out mucus other than clear.  Appetite okay, drinking water and diet pepsi.  She has some pain with deep breathing on the left mid back.    Review of Systems  Constitutional:  Positive for chills.  HENT:  Positive for ear pain, postnasal drip and rhinorrhea.   Respiratory:  Positive for cough and shortness of breath.   Musculoskeletal:  Positive for myalgias.     Lab Results  Component Value Date   NA 140 10/08/2023   K 4.4 10/08/2023   CO2 21 10/08/2023   GLUCOSE 108 (H) 10/08/2023   BUN 7 (L) 10/08/2023   CREATININE 0.99 10/08/2023   CALCIUM 9.2 10/08/2023   EGFR 55 (L) 10/08/2023   GFRNONAA >60 09/25/2023   Lab Results  Component Value Date   CHOL 173 10/08/2022   HDL 52 10/08/2022   LDLCALC 85 10/08/2022   TRIG 219 (H) 10/08/2022   CHOLHDL 3.3 10/08/2022   Lab Results  Component Value Date   TSH 1.370 10/08/2022   No results found for: HGBA1C Lab Results  Component Value Date   WBC 7.2 10/08/2023   HGB 13.3 10/08/2023   HCT 41.8 10/08/2023   MCV 95 10/08/2023   PLT 276 10/08/2023   Lab Results  Component Value Date   ALT 12 10/08/2022   AST 17 10/08/2022   ALKPHOS 85 10/08/2022   BILITOT 0.4 10/08/2022   No results found for: MARIEN BOLLS,  VD25OH   Patient Active Problem List   Diagnosis Date Noted   Essential hypertension 10/08/2022   Tachy-brady syndrome (HCC) 12/04/2021   GERD without esophagitis 12/04/2021   Generalized anxiety disorder 12/04/2021   Degenerative disc disease, lumbar 12/04/2021   OAB (overactive bladder) 12/04/2021   Acquired hypothyroidism 12/04/2021   Mixed hyperlipidemia 08/08/2021   Hx of deep venous thrombosis 08/08/2021   Osteoporosis 08/08/2021   PVD (peripheral vascular disease) 08/08/2021   Hx of heart artery stent 08/08/2021   Hiatal hernia 08/08/2021   Asthma 11/14/2020   Atherosclerotic heart disease of native coronary artery without angina pectoris 01/10/2016   PVCs (premature ventricular contractions) 01/10/2016    Allergies  Allergen Reactions   Penicillins Hives and Other (See Comments)   Sulfur Dioxide Nausea Only and Other (See Comments)   Other Other (See Comments)   Sulfa Antibiotics Nausea Only, Other (See Comments) and Diarrhea    HEADACHE     Past Surgical History:  Procedure Laterality Date   ABDOMINAL HYSTERECTOMY     ANTERIOR FUSION CERVICAL SPINE     twice   CHOLECYSTECTOMY     CORONARY ANGIOPLASTY WITH STENT PLACEMENT     proximal LAD and mid RCA   LUMBAR DISC SURGERY     Pace maker  11/06/2016   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  Current Meds  Medication Sig   Acetaminophen (TYLENOL PO) Take by mouth as needed.   alendronate  (FOSAMAX ) 70 MG tablet Take 1 tablet by mouth once a week   Calcium 200 MG TABS Take 600 mg by mouth 2 (two) times daily.   Cholecalciferol (VITAMIN D3) 250 MCG (10000 UT) CAPS Take by mouth.   clopidogrel  (PLAVIX ) 75 MG tablet Take 1  tablet (75 mg total) by mouth daily.   esomeprazole (NEXIUM) 20 MG capsule Take 20 mg by mouth daily at 12 noon.   ipratropium (ATROVENT) 0.06 % nasal spray Place 2 sprays into both nostrils 4 (four) times daily.   levothyroxine  (SYNTHROID ) 50 MCG tablet TAKE 1 TABLET BY MOUTH ONCE DAILY BEFORE BREAKFAST   LORazepam  (ATIVAN ) 0.5 MG tablet Take 1 tablet (0.5 mg total) by mouth 2 (two) times daily as needed. for anxiety   mirtazapine  (REMERON ) 30 MG tablet Take 1 tablet by mouth once daily   NIFEdipine  (PROCARDIA -XL/NIFEDICAL-XL) 30 MG 24 hr tablet TAKE ONE TABLET (30 MG) BY MOUTH ONCE DAILY.   nitroGLYCERIN (NITROSTAT) 0.4 MG SL tablet Place under the tongue.   oxybutynin  (DITROPAN -XL) 10 MG 24 hr tablet Take 1 tablet by mouth once daily   pantoprazole  (PROTONIX ) 40 MG tablet Take 1 tablet by mouth twice daily   pravastatin  (PRAVACHOL ) 40 MG tablet Take 1 tablet (40 mg total) by mouth daily.   predniSONE (DELTASONE) 10 MG tablet Take 6 tablets (60 mg total) by mouth daily with breakfast for 1 day, THEN 5 tablets (50 mg total) daily with breakfast for 1 day, THEN 4 tablets (40 mg total) daily with breakfast for 1 day, THEN 3 tablets (30 mg total) daily with breakfast for 1 day, THEN 2 tablets (20 mg total) daily with breakfast for 1 day, THEN 1 tablet (10 mg total) daily with breakfast for 1 day.   sacubitril-valsartan (ENTRESTO ) 24-26 MG Take 1 tablet by mouth twice daily   SOTALOL  AF 80 MG TABS Take 1 tablet by mouth twice daily   spironolactone  (ALDACTONE ) 25 MG tablet Take 0.5 tablets (12.5 mg total) by mouth daily.   traMADol (ULTRAM) 50 MG tablet Take 50 mg by mouth every 6 (six) hours as needed.       12/14/2023    1:28 PM 09/07/2023   10:12 AM 04/09/2023   10:04 AM 10/08/2022   10:02 AM  GAD 7 : Generalized Anxiety Score  Nervous, Anxious, on Edge 0 0 0 0  Control/stop worrying 0 0 0 0  Worry too much - different things 0 0 0 0  Trouble relaxing 0 0 0 0  Restless 0 0 0 0  Easily  annoyed or irritable 0 0 0 0  Afraid - awful might happen 0 0 0 0  Total GAD 7 Score 0 0 0 0  Anxiety Difficulty Not difficult at all Not difficult at all Not difficult at all Not difficult at all       12/14/2023    1:28 PM 09/07/2023   10:11 AM 04/09/2023   10:04 AM  Depression screen PHQ 2/9  Decreased Interest 0 0 0  Down, Depressed, Hopeless 0 0 0  PHQ - 2 Score 0 0 0  Altered sleeping  1   Tired, decreased energy  1   Change in appetite  2   Feeling bad or failure about yourself   0   Trouble concentrating  0   Moving slowly or fidgety/restless  0   Suicidal thoughts  0   PHQ-9 Score  4   Difficult doing work/chores  Not difficult at all     BP Readings from Last 3 Encounters:  12/14/23 122/84  12/11/23 120/80  11/24/23 128/70    Physical Exam Constitutional:      Appearance: She is ill-appearing.  HENT:     Nose:     Right Sinus: Maxillary sinus tenderness present. No frontal sinus tenderness.     Left Sinus: Maxillary sinus tenderness present. No frontal sinus tenderness.     Mouth/Throat:     Mouth: Mucous membranes are moist.     Pharynx: No oropharyngeal exudate or posterior oropharyngeal erythema.  Cardiovascular:     Rate and Rhythm: Normal rate and regular rhythm.     Pulses: Normal pulses.  Pulmonary:     Effort: Pulmonary effort is normal.     Breath sounds: Normal breath sounds. No wheezing or rhonchi.  Abdominal:     Palpations: Abdomen is soft.     Tenderness: There is no abdominal tenderness.  Musculoskeletal:     Cervical back: Normal range of motion.  Lymphadenopathy:     Cervical: No cervical adenopathy.  Skin:    General: Skin is warm and dry.  Neurological:     Mental Status: She is alert.     Wt Readings from Last 3 Encounters:  12/14/23 170 lb (77.1 kg)  12/11/23 174 lb 6.1 oz (79.1 kg)  11/24/23 174 lb 6 oz (79.1 kg)    BP 122/84   Pulse 70   Ht 5' (1.524 m)   Wt 170 lb (77.1 kg)   SpO2 96%   BMI 33.20 kg/m    Assessment and Plan:  Problem List Items Addressed This Visit   None Visit Diagnoses       Sinus congestion    -  Primary   will give steroid taper which will help with pleuritc chest pain if sx of bacterial infection occur - call fo antibiotics continue fluids, rest     Chills       UA with leuks only will get culture   Relevant Orders   POCT Urinalysis Dipstick (Completed)   CBC with Differential/Platelet     Pleuritic chest pain       will get CBC, BMP and CXR start steroid taper - start antibiotics of labs or imaging indicates need   Relevant Medications   predniSONE (DELTASONE) 10 MG tablet   Other Relevant Orders   DG Chest 2 View   Basic metabolic panel with GFR       No follow-ups on file.    [name], MD [facility] [place]

## 2023-12-14 NOTE — Progress Notes (Unsigned)
 Date:  12/14/2023   Name:  Sandra Murphy   DOB:  10/31/1933   MRN:  [id number]   Chief Complaint: No chief complaint on file.  HPI  Review of Systems   Lab Results  Component Value Date   NA 140 10/08/2023   K 4.4 10/08/2023   CO2 21 10/08/2023   GLUCOSE 108 (H) 10/08/2023   BUN 7 (L) 10/08/2023   CREATININE 0.99 10/08/2023   CALCIUM 9.2 10/08/2023   EGFR 55 (L) 10/08/2023   GFRNONAA >60 09/25/2023   Lab Results  Component Value Date   CHOL 173 10/08/2022   HDL 52 10/08/2022   LDLCALC 85 10/08/2022   TRIG 219 (H) 10/08/2022   CHOLHDL 3.3 10/08/2022   Lab Results  Component Value Date   TSH 1.370 10/08/2022   No results found for: HGBA1C Lab Results  Component Value Date   WBC 7.2 10/08/2023   HGB 13.3 10/08/2023   HCT 41.8 10/08/2023   MCV 95 10/08/2023   PLT 276 10/08/2023   Lab Results  Component Value Date   ALT 12 10/08/2022   AST 17 10/08/2022   ALKPHOS 85 10/08/2022   BILITOT 0.4 10/08/2022   No results found for: MARIEN BOLLS, VD25OH   Patient Active Problem List   Diagnosis Date Noted   Essential hypertension 10/08/2022   Tachy-brady syndrome (HCC) 12/04/2021   GERD without esophagitis 12/04/2021   Generalized anxiety disorder 12/04/2021   Degenerative disc disease, lumbar 12/04/2021   OAB (overactive bladder) 12/04/2021   Acquired hypothyroidism 12/04/2021   Mixed hyperlipidemia 08/08/2021   Hx of deep venous thrombosis 08/08/2021   Osteoporosis 08/08/2021   PVD (peripheral vascular disease) 08/08/2021   Hx of heart artery stent 08/08/2021   Hiatal hernia 08/08/2021   Asthma 11/14/2020   Atherosclerotic heart disease of native coronary artery without angina pectoris 01/10/2016   PVCs (premature ventricular contractions) 01/10/2016    Allergies  Allergen Reactions   Penicillins Hives and Other (See Comments)   Sulfur Dioxide Nausea Only and Other (See Comments)   Other Other (See Comments)   Sulfa Antibiotics  Nausea Only, Other (See Comments) and Diarrhea    HEADACHE     Past Surgical History:  Procedure Laterality Date   ABDOMINAL HYSTERECTOMY     ANTERIOR FUSION CERVICAL SPINE     twice   CHOLECYSTECTOMY     CORONARY ANGIOPLASTY WITH STENT PLACEMENT     proximal LAD and mid RCA   LUMBAR DISC SURGERY     Pace maker  11/06/2016   Medtronic [place] Scietific Pacemaker Model L311/ Serial [id number]   THYROIDECTOMY Left     Social History   Tobacco Use   Smoking status: Never   Smokeless tobacco: Never  Vaping Use   Vaping status: Never Used  Substance Use Topics   Alcohol use: No   Drug use: Never     Medication list has been reviewed and updated.  No outpatient medications have been marked as taking for the 12/14/23 encounter (Orders Only) with Justus Leita DEL, MD.       09/07/2023   10:12 AM 04/09/2023   10:04 AM 10/08/2022   10:02 AM 03/26/2022   10:28 AM  GAD 7 : Generalized Anxiety Score  Nervous, Anxious, on Edge 0 0 0 1  Control/stop worrying 0 0 0 0  Worry too much - different things 0 0 0 0  Trouble relaxing 0 0 0 0  Restless 0 0 0 0  Easily  annoyed or irritable 0 0 0 0  Afraid - awful might happen 0 0 0 0  Total GAD 7 Score 0 0 0 1  Anxiety Difficulty Not difficult at all Not difficult at all Not difficult at all Not difficult at all       09/07/2023   10:11 AM 04/09/2023   10:04 AM 01/14/2023   10:23 AM  Depression screen PHQ 2/9  Decreased Interest 0 0 0  Down, Depressed, Hopeless 0 0 0  PHQ - 2 Score 0 0 0  Altered sleeping 1  0  Tired, decreased energy 1  0  Change in appetite 2  0  Feeling bad or failure about yourself  0  0  Trouble concentrating 0  0  Moving slowly or fidgety/restless 0  0  Suicidal thoughts 0  0  PHQ-9 Score 4  0  Difficult doing work/chores Not difficult at all  Not difficult at all    BP Readings from Last 3 Encounters:  12/11/23 120/80  11/24/23 128/70  10/08/23 116/75    Physical Exam  Wt Readings from Last 3  Encounters:  12/11/23 174 lb 6.1 oz (79.1 kg)  11/24/23 174 lb 6 oz (79.1 kg)  10/08/23 172 lb (78 kg)    There were no vitals taken for this visit.  Assessment and Plan:  Problem List Items Addressed This Visit   None   No follow-ups on file.    [name], MD [facility] [place]

## 2023-12-14 NOTE — Telephone Encounter (Signed)
 Noted  Pt has appt.  KP

## 2023-12-15 LAB — BASIC METABOLIC PANEL WITH GFR
BUN/Creatinine Ratio: 13 (ref 12–28)
BUN: 12 mg/dL (ref 8–27)
CO2: 21 mmol/L (ref 20–29)
Calcium: 8.8 mg/dL (ref 8.7–10.3)
Chloride: 106 mmol/L (ref 96–106)
Creatinine, Ser: 0.89 mg/dL (ref 0.57–1.00)
Glucose: 97 mg/dL (ref 70–99)
Potassium: 4.1 mmol/L (ref 3.5–5.2)
Sodium: 141 mmol/L (ref 134–144)
eGFR: 62 mL/min/1.73 (ref >59)

## 2023-12-15 LAB — CBC WITH DIFFERENTIAL/PLATELET
Basophils Absolute: 0 x10E3/uL (ref 0.0–0.2)
Basos: 0 %
EOS (ABSOLUTE): 0 x10E3/uL (ref 0.0–0.4)
Eos: 0 %
Hematocrit: 38.1 % (ref 34.0–46.6)
Hemoglobin: 12.6 g/dL (ref 11.1–15.9)
Immature Grans (Abs): 0 x10E3/uL (ref 0.0–0.1)
Immature Granulocytes: 0 %
Lymphocytes Absolute: 1.6 x10E3/uL (ref 0.7–3.1)
Lymphs: 23 %
MCH: 30.7 pg (ref 26.6–33.0)
MCHC: 33.1 g/dL (ref 31.5–35.7)
MCV: 93 fL (ref 79–97)
Monocytes Absolute: 0.6 x10E3/uL (ref 0.1–0.9)
Monocytes: 8 %
Neutrophils Absolute: 4.8 x10E3/uL (ref 1.4–7.0)
Neutrophils: 69 %
Platelets: 247 x10E3/uL (ref 150–450)
RBC: 4.1 x10E6/uL (ref 3.77–5.28)
RDW: 13.1 % (ref 11.7–15.4)
WBC: 7 x10E3/uL (ref 3.4–10.8)

## 2023-12-19 LAB — URINE CULTURE

## 2023-12-20 ENCOUNTER — Ambulatory Visit: Payer: Self-pay | Admitting: Internal Medicine

## 2023-12-23 NOTE — Addendum Note (Signed)
 Addended by: VICCI SELLER A on: 12/23/2023 08:46 AM   Modules accepted: Orders

## 2023-12-23 NOTE — Progress Notes (Signed)
 Remote pacemaker transmission.

## 2023-12-30 DIAGNOSIS — M545 Low back pain, unspecified: Secondary | ICD-10-CM | POA: Diagnosis not present

## 2023-12-30 DIAGNOSIS — Z79899 Other long term (current) drug therapy: Secondary | ICD-10-CM | POA: Diagnosis not present

## 2023-12-30 DIAGNOSIS — M5416 Radiculopathy, lumbar region: Secondary | ICD-10-CM | POA: Diagnosis not present

## 2024-01-11 ENCOUNTER — Encounter: Payer: Self-pay | Admitting: Student

## 2024-01-11 ENCOUNTER — Ambulatory Visit: Payer: Self-pay

## 2024-01-11 ENCOUNTER — Ambulatory Visit (INDEPENDENT_AMBULATORY_CARE_PROVIDER_SITE_OTHER): Admitting: Student

## 2024-01-11 VITALS — BP 90/68 | HR 71 | Temp 97.4°F | Ht 60.0 in | Wt 170.0 lb

## 2024-01-11 DIAGNOSIS — R509 Fever, unspecified: Secondary | ICD-10-CM | POA: Insufficient documentation

## 2024-01-11 DIAGNOSIS — R079 Chest pain, unspecified: Secondary | ICD-10-CM | POA: Insufficient documentation

## 2024-01-11 NOTE — Telephone Encounter (Signed)
 Noted  Pt has appt.  KP

## 2024-01-11 NOTE — Telephone Encounter (Signed)
 FYI Only or Action Required?: FYI only for provider: appointment scheduled on 01/11/24.  Patient was last seen in primary care on 12/14/2023 by Justus Leita DEL, MD.  Called Nurse Triage reporting Fever and Night Sweats.  Symptoms began several days ago.  Interventions attempted: OTC medications: Tylenol.  Symptoms are: unchanged.  Triage Disposition: See Physician Within 24 Hours  Patient/caregiver understands and will follow disposition?: Yes                                 Reason for Triage: Patients daughter called in stating the patient is not feeling well.  - Waking up in the middle of the night sweating  - Night sweats  - Stated she is not feeling very well  - Low grade Fever 99.4    **Please call patients daughter at: [phone]**   Reason for Disposition  Fever present > 3 days (72 hours)  Answer Assessment - Initial Assessment Questions 1. TEMPERATURE: What is the most recent temperature?  How was it measured?      99.4 yesterday  2. ONSET: When did the fever start?      Symptoms started a few days ago 3. CHILLS: Do you have chills? If yes: How bad are they?  (e.g., none, mild, moderate, severe)     Daughter states patient is cold at baseline, experiencing night sweats 4. OTHER SYMPTOMS: Do you have any other symptoms besides the fever?  (e.g., abdomen pain, cough, diarrhea, earache, headache, sore throat, urination pain)     Body aches, denies additional cold/flu symptoms, denies difficulty breathing, reports ROM in neck 5. CAUSE: If there are no symptoms, ask: What do you think is causing the fever?      Unsure 6. CONTACTS: Does anyone else in the family have an infection?     Denies 7. TREATMENT: What have you done so far to treat this fever? (e.g., OTC fever medicines)     Tylenol 8. IMMUNOCOMPROMISE: Do you have any of the following: diabetes, HIV positive, splenectomy, cancer chemotherapy, chronic steroid  treatment, transplant patient, etc.?     Denies, patient is 28 9. PREGNANCY: Is there any chance you are pregnant? When was your last menstrual period?     N/A 10. TRAVEL: Have you traveled out of the country in the last month? (e.g., travel history, exposures)     Denies    This RN spoke to patient's daughter, Donny (on [place]).  Protocols used: [facility]

## 2024-01-11 NOTE — Progress Notes (Signed)
 Established Patient Office Visit  Subjective   Patient ID: Sandra Murphy, female    DOB: Jul 16, 1933  Age: 89 y.o. MRN: [id number]  Chief Complaint  Patient presents with   Fever    Friday night, Sunday morning 99.4   Night Sweats    Weakness    Sandra Murphy is a 89 y.o. person with medical hx listed below who presents today for weakness for the past 3 days. Patient interviewed with daughter and granddaughter present.   Reports she did not feel good Friday night. Having fever and chills since Friday night. This morning starting having constant achy chest pain and that radiates to the back. Once episode of nausea without vomiting. Also some sinus congestion. Has been feeling tired and weak, not eating much since she has been feeling poorly. Has not been walking as she is worried she may fall if she stands for too long. Is able to transfer. She denies sore throat, dyspnea, cough, abdominal pain, diarrhea, constipation, dysuria, urinary frequency, dizziness, neck pain, confusion, or LOC.   Patient Active Problem List   Diagnosis Date Noted   Chest pain 01/11/2024   Fever 01/11/2024   Essential hypertension 10/08/2022   Tachy-brady syndrome (HCC) 12/04/2021   GERD without esophagitis 12/04/2021   Generalized anxiety disorder 12/04/2021   Degenerative disc disease, lumbar 12/04/2021   OAB (overactive bladder) 12/04/2021   Acquired hypothyroidism 12/04/2021   Mixed hyperlipidemia 08/08/2021   Hx of deep venous thrombosis 08/08/2021   Osteoporosis 08/08/2021   PVD (peripheral vascular disease) 08/08/2021   Hx of heart artery stent 08/08/2021   Hiatal hernia 08/08/2021   Asthma 11/14/2020   Atherosclerotic heart disease of native coronary artery without angina pectoris 01/10/2016   PVCs (premature ventricular contractions) 01/10/2016      ROS Refer to HPI    Objective:     Outpatient Encounter Medications as of 01/11/2024  Medication Sig   Acetaminophen (TYLENOL PO) Take by  mouth as needed.   alendronate  (FOSAMAX ) 70 MG tablet Take 1 tablet by mouth once a week   Calcium 200 MG TABS Take 600 mg by mouth 2 (two) times daily.   Cholecalciferol (VITAMIN D3) 250 MCG (10000 UT) CAPS Take by mouth.   clopidogrel  (PLAVIX ) 75 MG tablet Take 1 tablet (75 mg total) by mouth daily.   esomeprazole (NEXIUM) 20 MG capsule Take 20 mg by mouth daily at 12 noon.   ipratropium (ATROVENT) 0.06 % nasal spray Place 2 sprays into both nostrils 4 (four) times daily.   levothyroxine  (SYNTHROID ) 50 MCG tablet TAKE 1 TABLET BY MOUTH ONCE DAILY BEFORE BREAKFAST   LORazepam  (ATIVAN ) 0.5 MG tablet Take 1 tablet (0.5 mg total) by mouth 2 (two) times daily as needed. for anxiety   mirtazapine  (REMERON ) 30 MG tablet Take 1 tablet by mouth once daily   NIFEdipine  (PROCARDIA -XL/NIFEDICAL-XL) 30 MG 24 hr tablet TAKE ONE TABLET (30 MG) BY MOUTH ONCE DAILY.   nitroGLYCERIN (NITROSTAT) 0.4 MG SL tablet Place under the tongue.   oxybutynin  (DITROPAN -XL) 10 MG 24 hr tablet Take 1 tablet by mouth once daily   pantoprazole  (PROTONIX ) 40 MG tablet Take 1 tablet by mouth twice daily   pravastatin  (PRAVACHOL ) 40 MG tablet Take 1 tablet (40 mg total) by mouth daily.   sacubitril-valsartan (ENTRESTO ) 24-26 MG Take 1 tablet by mouth twice daily   SOTALOL  AF 80 MG TABS Take 1 tablet by mouth twice daily   spironolactone  (ALDACTONE ) 25 MG tablet Take 0.5 tablets (12.5  mg total) by mouth daily.   traMADol (ULTRAM) 50 MG tablet Take 50 mg by mouth every 6 (six) hours as needed.   No facility-administered encounter medications on file as of 01/11/2024.    BP 90/68   Pulse 71   Temp (!) 97.4 F (36.3 C) (Oral)   Ht 5' (1.524 m)   Wt 170 lb (77.1 kg)   SpO2 97%   BMI 33.20 kg/m  BP Readings from Last 3 Encounters:  01/11/24 90/68  12/14/23 122/84  12/11/23 120/80    Physical Exam Constitutional:      Comments: Tired appearing  HENT:     Nose: Congestion present.     Mouth/Throat:     Mouth:  Mucous membranes are dry.     Pharynx: Oropharynx is clear.  Eyes:     Extraocular Movements: Extraocular movements intact.     Pupils: Pupils are equal, round, and reactive to light.  Cardiovascular:     Rate and Rhythm: Normal rate and regular rhythm.     Heart sounds: No murmur heard. Pulmonary:     Effort: Pulmonary effort is normal.     Breath sounds: Wheezing (occaisonal left sided expiratory wheeze) present. No rales.  Abdominal:     General: Abdomen is flat. Bowel sounds are normal. There is no distension.     Palpations: Abdomen is soft.     Tenderness: There is no abdominal tenderness.  Musculoskeletal:        General: Normal range of motion.     Right lower leg: No edema.     Left lower leg: No edema.  Skin:    General: Skin is warm and dry.     Capillary Refill: Capillary refill takes less than 2 seconds.  Neurological:     General: No focal deficit present.     Mental Status: She is alert and oriented to person, place, and time.  Psychiatric:        Mood and Affect: Mood normal.        Behavior: Behavior normal.        12/14/2023    1:28 PM 09/07/2023   10:11 AM 04/09/2023   10:04 AM  Depression screen PHQ 2/9  Decreased Interest 0 0 0  Down, Depressed, Hopeless 0 0 0  PHQ - 2 Score 0 0 0  Altered sleeping  1   Tired, decreased energy  1   Change in appetite  2   Feeling bad or failure about yourself   0   Trouble concentrating  0   Moving slowly or fidgety/restless  0   Suicidal thoughts  0   PHQ-9 Score  4    Difficult doing work/chores  Not difficult at all      Data saved with a previous flowsheet row definition       12/14/2023    1:28 PM 09/07/2023   10:12 AM 04/09/2023   10:04 AM 10/08/2022   10:02 AM  GAD 7 : Generalized Anxiety Score  Nervous, Anxious, on Edge 0 0 0 0  Control/stop worrying 0 0 0 0  Worry too much - different things 0 0 0 0  Trouble relaxing 0 0 0 0  Restless 0 0 0 0  Easily annoyed or irritable 0 0 0 0  Afraid - awful  might happen 0 0 0 0  Total GAD 7 Score 0 0 0 0  Anxiety Difficulty Not difficult at all Not difficult at all Not difficult at all Not difficult  at all    No results found for any visits on 01/11/24.  Last CBC Lab Results  Component Value Date   WBC 7.0 12/14/2023   HGB 12.6 12/14/2023   HCT 38.1 12/14/2023   MCV 93 12/14/2023   MCH 30.7 12/14/2023   RDW 13.1 12/14/2023   PLT 247 12/14/2023   Last metabolic panel Lab Results  Component Value Date   GLUCOSE 97 12/14/2023   NA 141 12/14/2023   K 4.1 12/14/2023   CL 106 12/14/2023   CO2 21 12/14/2023   BUN 12 12/14/2023   CREATININE 0.89 12/14/2023   EGFR 62 12/14/2023   CALCIUM 8.8 12/14/2023   PROT 6.6 10/08/2022   ALBUMIN 4.2 10/08/2022   LABGLOB 2.4 10/08/2022   AGRATIO 2.0 03/26/2022   BILITOT 0.4 10/08/2022   ALKPHOS 85 10/08/2022   AST 17 10/08/2022   ALT 12 10/08/2022   ANIONGAP 12 09/25/2023   Last lipids Lab Results  Component Value Date   CHOL 173 10/08/2022   HDL 52 10/08/2022   LDLCALC 85 10/08/2022   TRIG 219 (H) 10/08/2022   CHOLHDL 3.3 10/08/2022   Last hemoglobin A1c No results found for: HGBA1C    The ASCVD Risk score (Arnett DK, et al., 2019) failed to calculate for the following reasons:   The 2019 ASCVD risk score is only valid for ages 30 to 10    Assessment & Plan:  Fever, unspecified fever cause -     CBC with Differential/Platelet -     Comprehensive metabolic panel with GFR -     Culture, blood (single) w Reflex to ID Panel -     Culture, blood (single) w Reflex to ID Panel  Chest pain, unspecified type -     EKG 12-Lead -     DG Chest 2 View; Future    Hypotensive today to 90/68, similar on repeat. Afebrile, mildly low BP. No obvious source infection on exam. EKG with HR of 91, occasional PVC but otherwise no significant changes. Chest discomfort may be due to her hiatal hernia. However given age and hypotension recommended she be evaluated in the ED. Differential included  infection, dehydration, and dissection. Discussed symptoms may worsen, may develop sepsis, fall, or death. She understand but does not want to go to the ED. Will obtain stat labs and CXR and discussed they will not result until the morning. Will have her hold antihypertensives including evening dose of entresto . Will have her increase PO hydration. Discuss if BP remains low, she develops fever, confusion, worsening chest pain, inability to tolerate PO intake she need to go to the ED. Patient and family understand.   Harlene Saddler, MD

## 2024-01-12 DIAGNOSIS — N1831 Chronic kidney disease, stage 3a: Secondary | ICD-10-CM | POA: Diagnosis not present

## 2024-01-12 DIAGNOSIS — Z882 Allergy status to sulfonamides status: Secondary | ICD-10-CM | POA: Diagnosis not present

## 2024-01-12 DIAGNOSIS — G4700 Insomnia, unspecified: Secondary | ICD-10-CM | POA: Diagnosis not present

## 2024-01-12 DIAGNOSIS — I2510 Atherosclerotic heart disease of native coronary artery without angina pectoris: Secondary | ICD-10-CM | POA: Diagnosis not present

## 2024-01-12 DIAGNOSIS — E785 Hyperlipidemia, unspecified: Secondary | ICD-10-CM | POA: Diagnosis not present

## 2024-01-12 DIAGNOSIS — K219 Gastro-esophageal reflux disease without esophagitis: Secondary | ICD-10-CM | POA: Diagnosis not present

## 2024-01-12 DIAGNOSIS — M810 Age-related osteoporosis without current pathological fracture: Secondary | ICD-10-CM | POA: Diagnosis not present

## 2024-01-12 DIAGNOSIS — Z818 Family history of other mental and behavioral disorders: Secondary | ICD-10-CM | POA: Diagnosis not present

## 2024-01-12 DIAGNOSIS — Z6833 Body mass index (BMI) 33.0-33.9, adult: Secondary | ICD-10-CM | POA: Diagnosis not present

## 2024-01-12 DIAGNOSIS — Z880 Allergy status to penicillin: Secondary | ICD-10-CM | POA: Diagnosis not present

## 2024-01-12 DIAGNOSIS — E669 Obesity, unspecified: Secondary | ICD-10-CM | POA: Diagnosis not present

## 2024-01-12 DIAGNOSIS — I509 Heart failure, unspecified: Secondary | ICD-10-CM | POA: Diagnosis not present

## 2024-01-12 DIAGNOSIS — I4891 Unspecified atrial fibrillation: Secondary | ICD-10-CM | POA: Diagnosis not present

## 2024-01-12 DIAGNOSIS — I130 Hypertensive heart and chronic kidney disease with heart failure and stage 1 through stage 4 chronic kidney disease, or unspecified chronic kidney disease: Secondary | ICD-10-CM | POA: Diagnosis not present

## 2024-01-12 DIAGNOSIS — Z7983 Long term (current) use of bisphosphonates: Secondary | ICD-10-CM | POA: Diagnosis not present

## 2024-01-12 DIAGNOSIS — Z9181 History of falling: Secondary | ICD-10-CM | POA: Diagnosis not present

## 2024-01-12 DIAGNOSIS — Z7902 Long term (current) use of antithrombotics/antiplatelets: Secondary | ICD-10-CM | POA: Diagnosis not present

## 2024-01-12 DIAGNOSIS — J45909 Unspecified asthma, uncomplicated: Secondary | ICD-10-CM | POA: Diagnosis not present

## 2024-01-12 DIAGNOSIS — Z9989 Dependence on other enabling machines and devices: Secondary | ICD-10-CM | POA: Diagnosis not present

## 2024-01-12 DIAGNOSIS — E039 Hypothyroidism, unspecified: Secondary | ICD-10-CM | POA: Diagnosis not present

## 2024-01-12 NOTE — Addendum Note (Signed)
 Addended by: LEMON RAISIN on: 01/12/2024 08:02 AM   Modules accepted: Orders

## 2024-01-14 ENCOUNTER — Telehealth: Payer: Self-pay | Admitting: Internal Medicine

## 2024-01-14 ENCOUNTER — Other Ambulatory Visit: Payer: Self-pay | Admitting: Internal Medicine

## 2024-01-14 DIAGNOSIS — R0981 Nasal congestion: Secondary | ICD-10-CM

## 2024-01-14 DIAGNOSIS — F411 Generalized anxiety disorder: Secondary | ICD-10-CM

## 2024-01-14 MED ORDER — DOXYCYCLINE HYCLATE 100 MG PO TABS: 100.0000 mg | ORAL_TABLET | Freq: Two times a day (BID) | ORAL | 0 refills | Status: AC

## 2024-01-14 MED ORDER — LORAZEPAM 0.5 MG PO TABS: 0.5000 mg | ORAL_TABLET | Freq: Two times a day (BID) | ORAL | 0 refills | Status: DC | PRN

## 2024-01-14 NOTE — Telephone Encounter (Signed)
 Still complaining of dry cough and sinus drainage.  Also joint pains and not feeling well.  CXR and labs last month were normal.  Given steroid taper for pleuritic chest pain and improved.  Seen last week with c/o weakness and cough but did have the ordered labs and CXR done. Recommend Doxycycline  for cough/sinus; push fluids.  If no improvement, will see next week.  Daughter expressed understanding.

## 2024-01-14 NOTE — Progress Notes (Unsigned)
 Date:  01/14/2024   Name:  Sandra Murphy   DOB:  1933-12-13   MRN:  [id number]   Chief Complaint: No chief complaint on file.  HPI  Review of Systems   Lab Results  Component Value Date   NA 141 12/14/2023   K 4.1 12/14/2023   CO2 21 12/14/2023   GLUCOSE 97 12/14/2023   BUN 12 12/14/2023   CREATININE 0.89 12/14/2023   CALCIUM 8.8 12/14/2023   EGFR 62 12/14/2023   GFRNONAA >60 09/25/2023   Lab Results  Component Value Date   CHOL 173 10/08/2022   HDL 52 10/08/2022   LDLCALC 85 10/08/2022   TRIG 219 (H) 10/08/2022   CHOLHDL 3.3 10/08/2022   Lab Results  Component Value Date   TSH 1.370 10/08/2022   No results found for: HGBA1C Lab Results  Component Value Date   WBC 7.0 12/14/2023   HGB 12.6 12/14/2023   HCT 38.1 12/14/2023   MCV 93 12/14/2023   PLT 247 12/14/2023   Lab Results  Component Value Date   ALT 12 10/08/2022   AST 17 10/08/2022   ALKPHOS 85 10/08/2022   BILITOT 0.4 10/08/2022   No results found for: MARIEN BOLLS, VD25OH   Patient Active Problem List   Diagnosis Date Noted   Chest pain 01/11/2024   Fever 01/11/2024   Essential hypertension 10/08/2022   Tachy-brady syndrome (HCC) 12/04/2021   GERD without esophagitis 12/04/2021   Generalized anxiety disorder 12/04/2021   Degenerative disc disease, lumbar 12/04/2021   OAB (overactive bladder) 12/04/2021   Acquired hypothyroidism 12/04/2021   Mixed hyperlipidemia 08/08/2021   Hx of deep venous thrombosis 08/08/2021   Osteoporosis 08/08/2021   PVD (peripheral vascular disease) 08/08/2021   Hx of heart artery stent 08/08/2021   Hiatal hernia 08/08/2021   Asthma 11/14/2020   Atherosclerotic heart disease of native coronary artery without angina pectoris 01/10/2016   PVCs (premature ventricular contractions) 01/10/2016    Allergies  Allergen Reactions   Penicillins Hives and Other (See Comments)   Sulfur Dioxide Nausea Only and Other (See Comments)   Other Other (See  Comments)   Sulfa Antibiotics Nausea Only, Other (See Comments) and Diarrhea    HEADACHE     Past Surgical History:  Procedure Laterality Date   ABDOMINAL HYSTERECTOMY     ANTERIOR FUSION CERVICAL SPINE     twice   CHOLECYSTECTOMY     CORONARY ANGIOPLASTY WITH STENT PLACEMENT     proximal LAD and mid RCA   LUMBAR DISC SURGERY     Pace maker  11/06/2016   [employer]onic [employer] Sciet[employer] Pacemaker Model L311/ Serial [id number]   THYROIDECTOMY Left     Social History   Tobacco Use   Smoking status: Never   Smokeless tobacco: Never  Vaping Use   Vaping status: Never Used  Substance Use Topics   Alcohol use: No   Drug use: Never     Medication list has been reviewed and updated.  No outpatient medications have been marked as taking for the 01/14/24 encounter (Orders Only) with Justus Leita DEL, MD.       12/14/2023    1:28 PM 09/07/2023   10:12 AM 04/09/2023   10:04 AM 10/08/2022   10:02 AM  GAD 7 : Generalized Anxiety Score  Nervous, Anxious, on Edge 0 0 0 0  Control/stop worrying 0 0 0 0  Worry too much - different things 0 0 0 0  Trouble relaxing 0 0 0 0  Restless 0 0 0 0  Easily annoyed or irritable 0 0 0 0  Afraid - awful might happen 0 0 0 0  Total GAD 7 Score 0 0 0 0  Anxiety Difficulty Not difficult at all Not difficult at all Not difficult at all Not difficult at all       12/14/2023    1:28 PM 09/07/2023   10:11 AM 04/09/2023   10:04 AM  Depression screen PHQ 2/9  Decreased Interest 0 0 0  Down, Depressed, Hopeless 0 0 0  PHQ - 2 Score 0 0 0  Altered sleeping  1   Tired, decreased energy  1   Change in appetite  2   Feeling bad or failure about yourself   0   Trouble concentrating  0   Moving slowly or fidgety/restless  0   Suicidal thoughts  0   PHQ-9 Score  4    Difficult doing work/chores  Not difficult at all      Data saved with a previous flowsheet row definition    BP Readings from Last 3 Encounters:  01/11/24 90/68  12/14/23 122/84   12/11/23 120/80    Physical Exam  Wt Readings from Last 3 Encounters:  01/11/24 170 lb (77.1 kg)  12/14/23 170 lb (77.1 kg)  12/11/23 174 lb 6.1 oz (79.1 kg)    There were no vitals taken for this visit.  Assessment and Plan:  Problem List Items Addressed This Visit   None   No follow-ups on file.    [name], MD [facility] [place]

## 2024-01-15 ENCOUNTER — Ambulatory Visit: Admitting: Internal Medicine

## 2024-01-16 ENCOUNTER — Other Ambulatory Visit: Payer: Self-pay

## 2024-01-16 ENCOUNTER — Observation Stay

## 2024-01-16 ENCOUNTER — Observation Stay
Admission: EM | Admit: 2024-01-16 | Discharge: 2024-01-18 | Disposition: A | Discharge status: Home / Self Care | Attending: Student | Admitting: Student

## 2024-01-16 ENCOUNTER — Emergency Department

## 2024-01-16 DIAGNOSIS — R509 Fever, unspecified: Secondary | ICD-10-CM | POA: Diagnosis present

## 2024-01-16 DIAGNOSIS — K449 Diaphragmatic hernia without obstruction or gangrene: Secondary | ICD-10-CM | POA: Diagnosis not present

## 2024-01-16 DIAGNOSIS — R2681 Unsteadiness on feet: Secondary | ICD-10-CM | POA: Insufficient documentation

## 2024-01-16 DIAGNOSIS — I2510 Atherosclerotic heart disease of native coronary artery without angina pectoris: Secondary | ICD-10-CM | POA: Diagnosis not present

## 2024-01-16 DIAGNOSIS — R61 Generalized hyperhidrosis: Secondary | ICD-10-CM | POA: Insufficient documentation

## 2024-01-16 DIAGNOSIS — I4891 Unspecified atrial fibrillation: Secondary | ICD-10-CM | POA: Diagnosis not present

## 2024-01-16 DIAGNOSIS — K219 Gastro-esophageal reflux disease without esophagitis: Secondary | ICD-10-CM | POA: Insufficient documentation

## 2024-01-16 DIAGNOSIS — R531 Weakness: Secondary | ICD-10-CM | POA: Diagnosis not present

## 2024-01-16 DIAGNOSIS — R5381 Other malaise: Secondary | ICD-10-CM | POA: Diagnosis not present

## 2024-01-16 DIAGNOSIS — E876 Hypokalemia: Secondary | ICD-10-CM | POA: Insufficient documentation

## 2024-01-16 DIAGNOSIS — I480 Paroxysmal atrial fibrillation: Secondary | ICD-10-CM | POA: Diagnosis not present

## 2024-01-16 DIAGNOSIS — R2689 Other abnormalities of gait and mobility: Secondary | ICD-10-CM | POA: Diagnosis not present

## 2024-01-16 DIAGNOSIS — Z9861 Coronary angioplasty status: Secondary | ICD-10-CM | POA: Diagnosis not present

## 2024-01-16 DIAGNOSIS — E039 Hypothyroidism, unspecified: Secondary | ICD-10-CM | POA: Diagnosis not present

## 2024-01-16 DIAGNOSIS — B349 Viral infection, unspecified: Secondary | ICD-10-CM | POA: Insufficient documentation

## 2024-01-16 DIAGNOSIS — I5032 Chronic diastolic (congestive) heart failure: Secondary | ICD-10-CM | POA: Insufficient documentation

## 2024-01-16 DIAGNOSIS — R54 Age-related physical debility: Secondary | ICD-10-CM

## 2024-01-16 DIAGNOSIS — Z8679 Personal history of other diseases of the circulatory system: Secondary | ICD-10-CM | POA: Insufficient documentation

## 2024-01-16 DIAGNOSIS — R058 Other specified cough: Secondary | ICD-10-CM | POA: Diagnosis not present

## 2024-01-16 DIAGNOSIS — R5383 Other fatigue: Secondary | ICD-10-CM | POA: Insufficient documentation

## 2024-01-16 DIAGNOSIS — Z950 Presence of cardiac pacemaker: Secondary | ICD-10-CM | POA: Diagnosis not present

## 2024-01-16 DIAGNOSIS — J019 Acute sinusitis, unspecified: Principal | ICD-10-CM | POA: Insufficient documentation

## 2024-01-16 DIAGNOSIS — I110 Hypertensive heart disease with heart failure: Secondary | ICD-10-CM | POA: Insufficient documentation

## 2024-01-16 DIAGNOSIS — M6281 Muscle weakness (generalized): Secondary | ICD-10-CM | POA: Insufficient documentation

## 2024-01-16 DIAGNOSIS — Z7902 Long term (current) use of antithrombotics/antiplatelets: Secondary | ICD-10-CM | POA: Insufficient documentation

## 2024-01-16 DIAGNOSIS — E538 Deficiency of other specified B group vitamins: Secondary | ICD-10-CM | POA: Insufficient documentation

## 2024-01-16 DIAGNOSIS — Z86718 Personal history of other venous thrombosis and embolism: Secondary | ICD-10-CM | POA: Insufficient documentation

## 2024-01-16 DIAGNOSIS — E785 Hyperlipidemia, unspecified: Secondary | ICD-10-CM | POA: Diagnosis not present

## 2024-01-16 HISTORY — DX: Chronic diastolic (congestive) heart failure: I50.32

## 2024-01-16 HISTORY — DX: Atrial premature depolarization: I49.1

## 2024-01-16 HISTORY — DX: Anxiety disorder, unspecified: F41.9

## 2024-01-16 LAB — URINALYSIS, ROUTINE W REFLEX MICROSCOPIC
Bilirubin Urine: NEGATIVE
Glucose, UA: NEGATIVE mg/dL
Hgb urine dipstick: NEGATIVE
Ketones, ur: 5 mg/dL — AB
Nitrite: NEGATIVE
Protein, ur: NEGATIVE mg/dL
Specific Gravity, Urine: 1.012 (ref 1.005–1.030)
pH: 5 (ref 5.0–8.0)

## 2024-01-16 LAB — COMPREHENSIVE METABOLIC PANEL WITH GFR
ALT: 8 U/L (ref 0–44)
AST: 18 U/L (ref 15–41)
Albumin: 4.4 g/dL (ref 3.5–5.0)
Alkaline Phosphatase: 77 U/L (ref 38–126)
Anion gap: 12 (ref 5–15)
BUN: 13 mg/dL (ref 8–23)
CO2: 22 mmol/L (ref 22–32)
Calcium: 9.1 mg/dL (ref 8.9–10.3)
Chloride: 106 mmol/L (ref 98–111)
Creatinine, Ser: 0.95 mg/dL (ref 0.44–1.00)
GFR, Estimated: 58 mL/min — ABNORMAL LOW (ref >60)
Glucose, Bld: 112 mg/dL — ABNORMAL HIGH (ref 70–99)
Potassium: 3.7 mmol/L (ref 3.5–5.1)
Sodium: 140 mmol/L (ref 135–145)
Total Bilirubin: 0.4 mg/dL (ref 0.0–1.2)
Total Protein: 7.2 g/dL (ref 6.5–8.1)

## 2024-01-16 LAB — CBC
HCT: 43.5 % (ref 36.0–46.0)
HCT: 40.3 % (ref 36.0–46.0)
Hemoglobin: 14.5 g/dL (ref 12.0–15.0)
Hemoglobin: 13.5 g/dL (ref 12.0–15.0)
MCH: 30 pg (ref 26.0–34.0)
MCH: 30.4 pg (ref 26.0–34.0)
MCHC: 33.3 g/dL (ref 30.0–36.0)
MCHC: 33.5 g/dL (ref 30.0–36.0)
MCV: 90.1 fL (ref 80.0–100.0)
MCV: 90.8 fL (ref 80.0–100.0)
Platelets: 301 K/uL (ref 150–400)
Platelets: 275 K/uL (ref 150–400)
RBC: 4.83 MIL/uL (ref 3.87–5.11)
RBC: 4.44 MIL/uL (ref 3.87–5.11)
RDW: 12.8 % (ref 11.5–15.5)
RDW: 12.7 % (ref 11.5–15.5)
WBC: 7.4 K/uL (ref 4.0–10.5)
WBC: 7.1 K/uL (ref 4.0–10.5)
nRBC: 0 % (ref 0.0–0.2)
nRBC: 0 % (ref 0.0–0.2)

## 2024-01-16 LAB — TSH: TSH: 0.802 u[IU]/mL (ref 0.350–4.500)

## 2024-01-16 LAB — RESP PANEL BY RT-PCR (RSV, FLU A&B, COVID)  RVPGX2
Influenza A by PCR: NEGATIVE
Influenza B by PCR: NEGATIVE
Resp Syncytial Virus by PCR: NEGATIVE
SARS Coronavirus 2 by RT PCR: NEGATIVE

## 2024-01-16 LAB — CREATININE, SERUM
Creatinine, Ser: 0.92 mg/dL (ref 0.44–1.00)
GFR, Estimated: 60 mL/min — ABNORMAL LOW (ref >60)

## 2024-01-16 LAB — LIPASE, BLOOD: Lipase: 37 U/L (ref 11–51)

## 2024-01-16 LAB — D-DIMER, QUANTITATIVE: D-Dimer, Quant: 0.6 ug{FEU}/mL — ABNORMAL HIGH (ref 0.00–0.50)

## 2024-01-16 LAB — PROCALCITONIN: Procalcitonin: <0.1 ng/mL

## 2024-01-16 LAB — MAGNESIUM: Magnesium: 1.6 mg/dL — ABNORMAL LOW (ref 1.7–2.4)

## 2024-01-16 LAB — TROPONIN T, HIGH SENSITIVITY
Troponin T High Sensitivity: <15 ng/L (ref 0–19)
Troponin T High Sensitivity: <15 ng/L (ref 0–19)

## 2024-01-16 MED ORDER — ACETAMINOPHEN 325 MG PO TABS
650.0000 mg | ORAL_TABLET | Freq: Four times a day (QID) | ORAL | Status: DC | PRN
  Administered 2024-01-17: 650 mg via ORAL
  Filled 2024-01-16: qty 2

## 2024-01-16 MED ORDER — IOHEXOL 350 MG/ML SOLN: 100.0000 mL | Freq: Once | INTRAVENOUS | Status: DC | PRN

## 2024-01-16 MED ORDER — ENOXAPARIN SODIUM 40 MG/0.4ML IJ SOSY
40.0000 mg | PREFILLED_SYRINGE | INTRAMUSCULAR | Status: DC
  Administered 2024-01-16 – 2024-01-17 (×2): 40 mg via SUBCUTANEOUS
  Filled 2024-01-16 (×2): qty 0.4

## 2024-01-16 MED ORDER — MIRTAZAPINE 15 MG PO TABS
30.0000 mg | ORAL_TABLET | Freq: Every day | ORAL | Status: DC
  Administered 2024-01-16 – 2024-01-18 (×3): 30 mg via ORAL
  Filled 2024-01-16 (×3): qty 2

## 2024-01-16 MED ORDER — NIFEDIPINE ER OSMOTIC RELEASE 30 MG PO TB24
30.0000 mg | ORAL_TABLET | Freq: Every day | ORAL | Status: DC
  Administered 2024-01-17 – 2024-01-18 (×2): 30 mg via ORAL
  Filled 2024-01-16 (×2): qty 1

## 2024-01-16 MED ORDER — LACTATED RINGERS IV SOLN: INTRAVENOUS | Status: AC

## 2024-01-16 MED ORDER — PRAVASTATIN SODIUM 20 MG PO TABS
40.0000 mg | ORAL_TABLET | Freq: Every day | ORAL | Status: DC
  Administered 2024-01-16 – 2024-01-18 (×3): 40 mg via ORAL
  Filled 2024-01-16 (×3): qty 2

## 2024-01-16 MED ORDER — PANTOPRAZOLE SODIUM 40 MG PO TBEC
40.0000 mg | DELAYED_RELEASE_TABLET | Freq: Two times a day (BID) | ORAL | Status: DC
  Administered 2024-01-16 – 2024-01-18 (×4): 40 mg via ORAL
  Filled 2024-01-16 (×4): qty 1

## 2024-01-16 MED ORDER — CLOPIDOGREL BISULFATE 75 MG PO TABS
75.0000 mg | ORAL_TABLET | Freq: Every day | ORAL | Status: DC
  Administered 2024-01-16 – 2024-01-18 (×3): 75 mg via ORAL
  Filled 2024-01-16 (×3): qty 1

## 2024-01-16 MED ORDER — LORAZEPAM 0.5 MG PO TABS
0.5000 mg | ORAL_TABLET | Freq: Two times a day (BID) | ORAL | Status: DC | PRN
  Administered 2024-01-16 – 2024-01-17 (×2): 0.5 mg via ORAL
  Filled 2024-01-16 (×2): qty 1

## 2024-01-16 MED ORDER — SODIUM CHLORIDE 0.9 % IV BOLUS
1000.0000 mL | Freq: Once | INTRAVENOUS | Status: AC
  Administered 2024-01-16: 1000 mL via INTRAVENOUS

## 2024-01-16 MED ORDER — OXYBUTYNIN CHLORIDE ER 10 MG PO TB24
10.0000 mg | ORAL_TABLET | Freq: Every day | ORAL | Status: DC
  Administered 2024-01-16 – 2024-01-18 (×3): 10 mg via ORAL
  Filled 2024-01-16 (×3): qty 1

## 2024-01-16 MED ORDER — SACUBITRIL-VALSARTAN 24-26 MG PO TABS
1.0000 | ORAL_TABLET | Freq: Two times a day (BID) | ORAL | Status: DC
  Administered 2024-01-16 – 2024-01-18 (×4): 1 via ORAL
  Filled 2024-01-16 (×5): qty 1

## 2024-01-16 MED ORDER — SPIRONOLACTONE 12.5 MG HALF TABLET
12.5000 mg | ORAL_TABLET | Freq: Every day | ORAL | Status: DC
  Administered 2024-01-16 – 2024-01-18 (×3): 12.5 mg via ORAL
  Filled 2024-01-16 (×4): qty 1

## 2024-01-16 MED ORDER — GUAIFENESIN-DM 100-10 MG/5ML PO SYRP
5.0000 mL | ORAL_SOLUTION | ORAL | Status: DC | PRN
  Administered 2024-01-16: 5 mL via ORAL
  Filled 2024-01-16: qty 10

## 2024-01-16 MED ORDER — SOTALOL HCL 80 MG PO TABS
80.0000 mg | ORAL_TABLET | Freq: Two times a day (BID) | ORAL | Status: DC
  Administered 2024-01-16 – 2024-01-18 (×4): 80 mg via ORAL
  Filled 2024-01-16 (×4): qty 1

## 2024-01-16 MED ORDER — DILTIAZEM HCL 25 MG/5ML IV SOLN
10.0000 mg | Freq: Once | INTRAVENOUS | Status: AC
  Administered 2024-01-16: 10 mg via INTRAVENOUS
  Filled 2024-01-16: qty 5

## 2024-01-16 MED ORDER — ACETAMINOPHEN 650 MG RE SUPP
650.0000 mg | Freq: Four times a day (QID) | RECTAL | Status: DC | PRN
Start: 2024-01-16 — End: 2024-01-18

## 2024-01-16 MED ORDER — LEVOTHYROXINE SODIUM 50 MCG PO TABS
50.0000 ug | ORAL_TABLET | Freq: Every day | ORAL | Status: DC
  Administered 2024-01-17 – 2024-01-18 (×2): 50 ug via ORAL
  Filled 2024-01-16 (×2): qty 1

## 2024-01-16 NOTE — ED Notes (Signed)
 NURSE HAILEY INFORMED OF BED ASSIGNED

## 2024-01-16 NOTE — ED Triage Notes (Addendum)
 C/O productive cough for clear sputum and intermittent fever all week.  States fever worse at night.  Started Doxycycline  on Thursday. Last tylenol  taken yesterday morning.  Also c/o decrease appetite and decreased PO intake.  AAOx3. Skin warm and dry. NAD

## 2024-01-16 NOTE — ED Provider Notes (Signed)
 [facility] Provider Note    Event Date/Time   First MD Initiated Contact with Patient 01/16/24 1132     (approximate)   History   Fever   HPI  Sandra Murphy is a 89 y.o. female who presents today with concern of generalized weakness cough and intermittent fevers apparently at home.  She tells me that on Monday started having some symptoms of increased weakness poor p.o. intake and chills at home.  Throughout the week continued with symptoms, was seen by her primary doctor, I reviewed her note from that time, was concern of possible hypotension then and considered sepsis, but the patient did not want to go to the emergency department at the time so she was continued to be monitored at home.  Was seen 2 days ago for continuation of symptoms and it seems that she was started on doxycycline  at that time, but despite this has had not had much improvement in symptoms.  Today continues to complain of vague fatigue nonproductive cough poor p.o. intake and nausea.  I also reviewed her chart, it seems that she did have a pacemaker inserted about 2 months ago.      Physical Exam   Triage Vital Signs: ED Triage Vitals  Encounter Vitals Group     BP 01/16/24 1045 115/88     Girls Systolic BP Percentile --      Girls Diastolic BP Percentile --      Boys Systolic BP Percentile --      Boys Diastolic BP Percentile --      Pulse Rate 01/16/24 1045 (!) 140     Resp 01/16/24 1045 18     Temp 01/16/24 1045 97.8 F (36.6 C)     Temp Source 01/16/24 1137 Oral     SpO2 01/16/24 1046 96 %     Weight --      Height --      Head Circumference --      Peak Flow --      Pain Score 01/16/24 1046 0     Pain Loc --      Pain Education --      Exclude from Growth Chart --     Most recent vital signs: Vitals:   01/16/24 1135 01/16/24 1137  BP:  (!) 120/90  Pulse:  (!) 121  Resp:  20  Temp:    SpO2: 95% 97%     General: Awake, no distress.  CV:  Good peripheral  perfusion.  Tachycardic and irregular Resp:  Normal effort.  Abd:  No distention.  Soft nontender Other:     ED Results / Procedures / Treatments   Labs (all labs ordered are listed, but only abnormal results are displayed) Labs Reviewed  COMPREHENSIVE METABOLIC PANEL WITH GFR - Abnormal; Notable for the following components:      Result Value   Glucose, Bld 112 (*)    GFR, Estimated 58 (*)    All other components within normal limits  RESP PANEL BY RT-PCR (RSV, FLU A&B, COVID)  RVPGX2  LIPASE, BLOOD  CBC  URINALYSIS, ROUTINE W REFLEX MICROSCOPIC     EKG  Irregularly irregular rhythm with what appears to be occasional pacer spikes, rate of about 125, axis of 55, right bundle branch block is present, no obvious ischemia that I appreciate on this EKG   RADIOLOGY No acute cardiopulmonary findings  PROCEDURES:  Critical Care performed: Yes, see critical care procedure note(s)  Procedures  MEDICATIONS ORDERED IN ED: Medications - No data to display   IMPRESSION / MDM / ASSESSMENT AND PLAN / ED COURSE  I reviewed the triage vital signs and the nursing notes.                               Patient's presentation is most consistent with acute presentation with potential threat to life or bodily function.  89 year old female who presents today with concern of weakness cough fatigue ongoing since Monday.  Here her lab work is fortunately reassuring, unfortunately she has been in a persistently tachycardic rate while here, and it appears to be likely from underlying atrial fibrillation.  She is not anticoagulated at this time, she tells me that she does have a history of A-fib.  Given the presentation at this time, we will interrogate her pacemaker, and provide fluids, suspect likely would benefit from rate control and possibly admission for continued gentle fluid resuscitation.   Clinical Course as of 01/16/24 1526  Sat Jan 16, 2024  1436 Patient remains tachycardic while  here, seems to be likely secondary to atrial fibrillation.  Will give attempt for rate control here, he does feel slight improvement after fluids. [SK]  1508 Received signout. 1 week of generalized weakness and fatigue. Persistently tachyhcardic. Comorbidities: Htn.  [HD]    Clinical Course User Index [HD] Nicholaus Rolland BRAVO, MD [SK] Fernand Rossie HERO, MD     FINAL CLINICAL IMPRESSION(S) / ED DIAGNOSES   Final diagnoses:  None     Rx / DC Orders   ED Discharge Orders     None        Note:  This document was prepared using Dragon voice recognition software and may include unintentional dictation errors.   Fernand Rossie HERO, MD 01/16/24 [phone]

## 2024-01-16 NOTE — ED Provider Notes (Signed)
 89 year old female with past medical history significant for CAD, hyperlipidemia, hypertension, hypothyroidism, IBS, atrial fibrillation, PVD, and osteoarthritis   Who presents with generalized weakness and intermittent fevers at home found to be in A-fib on arrival  Clinical Course as of 01/17/24 0035  Sat Jan 16, 2024  1436 Patient remains tachycardic while here, seems to be likely secondary to atrial fibrillation.  Will give attempt for rate control here, he does feel slight improvement after fluids. [SK]  1508 Received signout. 1 week of generalized weakness and fatigue. Persistently tachyhcardic. Comorbidities: Htn.  [HD]  1606 Case discussed with hospitalist, at this time he does not think she needs admission however patient still remains in A-fib.  I expressed my concerns regarding the patient's trajectory and ongoing weakness and still appears to be in A-fib although has an increase Chad Vasc score.  He will come down to evaluate the patient [HD]    Clinical Course User Index [HD] Nicholaus Rolland BRAVO, MD [SK] Fernand Rossie HERO, MD   Patient was ultimately admitted by the hospitalist team   Nicholaus Rolland BRAVO, MD 01/17/24 [phone]

## 2024-01-16 NOTE — H&P (Signed)
 History and Physical    FRANKEE GRITZ FMW:[id number] DOB: 1934-02-16 DOA: 01/16/2024  PCP: Justus Leita DEL, MD  Patient coming from: home  I have personally briefly reviewed patient's old medical records in [facility]  Chief Complaint: feels weak/sick  HPI: Sandra Murphy is Nyilah Kight 89 y.o. female with medical history significant of atrial fib (not on anticoagulation, tachybrady syndrome, CAD s/p PCI, HTN, HFpEF and other medical issues here with general malaise.   She notes her symptoms started around Monday.  At that time she noticed night sweats, her Pj's were damp when she got up in the AM.  Her appetite was decreased and she wasn't eating/drinking like she thought she should be.  She saw Dr. Lemon on 11/17 who noted that she was hypotensive and it was recommended she be evaluated in the ED, but the patient refused.  She was instructed to hold her antihypertensives and increase her PO hydration.   Wednesday and Thursday she noticed diarrhea (maybe 3 episodes).  She took imodium.  Her daughter spoke to her PCP on 11/20 who called in doxycycline  for presumed sinusitis.  She feels like she just hasn't been getting any better.  Has felt weak and sick.  She's taken 4 doses of her doxy.  On the morning of admission, she felt nauseous.  No vomiting, only able to get up phlegm.  She notes subjective fever, chills/night sweats, cough productive of phlegm.  She describes vague chest and back pain present on Monday, but now resolved (now only has back pain related to stretcher).  She notes SOB.  Denies abdominal pain.  Denies dysuria.  Notes her diarrhea has resolved.  Denies smoking, drinking.  ED Course: labs, imaging.  IV dilt and IVF for RVR.  Admit to hospitalists.   Review of Systems: As per HPI otherwise all other systems reviewed and are negative.  Past Medical History:  Diagnosis Date   CAD (coronary artery disease)    stents to proximal LAD and mid RCA   Hyperlipidemia    Hypertension     Hypothyroidism    IBS (irritable bowel syndrome)    Osteoarthritis    PAF (paroxysmal atrial fibrillation) (HCC)    Hallie Ertl. Subclinical - detected on device monitoring-->sotalol .  No OAC given low-burden.   PVD (peripheral vascular disease)    SVT (supraventricular tachycardia)    Tachy-brady syndrome (HCC)    Nahum Sherrer. 10/2016 s/p BSX L311 Accolade MRI DC PPM.    Past Surgical History:  Procedure Laterality Date   ABDOMINAL HYSTERECTOMY     ANTERIOR FUSION CERVICAL SPINE     twice   CHOLECYSTECTOMY     CORONARY ANGIOPLASTY WITH STENT PLACEMENT     proximal LAD and mid RCA   LUMBAR DISC SURGERY     Pace maker  11/06/2016   Medtronic [facility] [employer] Pacemaker Model L311/ Serial [id number]   THYROIDECTOMY Left     Social History  reports that she has never smoked. She has never used smokeless tobacco. She reports that she does not drink alcohol and does not use drugs.  Allergies  Allergen Reactions   Penicillins Hives and Other (See Comments)   Sulfur Dioxide Nausea Only and Other (See Comments)   Other Other (See Comments)   Sulfa Antibiotics Nausea Only, Other (See Comments) and Diarrhea    HEADACHE     Family History  Problem Relation Age of Onset   Cancer Mother    Hearing loss Father    Heart attack Father  Hypertension Sister    Heart disease Sister    Diabetes Sister    Pancreatic cancer Sister    Liver cancer Sister    Heart disease Brother    Lung disease Brother      Prior to Admission medications   Medication Sig Start Date End Date Taking? Authorizing Provider  Acetaminophen  (TYLENOL  PO) Take by mouth as needed.    [provider]  alendronate  (FOSAMAX ) 70 MG tablet Take 1 tablet by mouth once Sayyid Harewood week 11/16/23   Justus Leita DEL, MD  Calcium 200 MG TABS Take 600 mg by mouth 2 (two) times daily.    [provider]  Cholecalciferol (VITAMIN D3) 250 MCG (10000 UT) CAPS Take by mouth.    [provider]  clopidogrel  (PLAVIX ) 75 MG tablet  Take 1 tablet (75 mg total) by mouth daily. 11/16/23   Lorene Lesley CROME, PA-C  doxycycline  (VIBRA -TABS) 100 MG tablet Take 1 tablet (100 mg total) by mouth 2 (two) times daily for 7 days. 01/14/24 01/21/24  Justus Leita DEL, MD  esomeprazole (NEXIUM) 20 MG capsule Take 20 mg by mouth daily at 12 noon.    [provider]  ipratropium (ATROVENT ) 0.06 % nasal spray Place 2 sprays into both nostrils 4 (four) times daily. 12/11/23   Bernardino Ditch, NP  levothyroxine  (SYNTHROID ) 50 MCG tablet TAKE 1 TABLET BY MOUTH ONCE DAILY BEFORE BREAKFAST 12/11/23   Justus Leita DEL, MD  LORazepam  (ATIVAN ) 0.5 MG tablet Take 1 tablet (0.5 mg total) by mouth 2 (two) times daily as needed. for anxiety 01/14/24   Justus Leita DEL, MD  mirtazapine  (REMERON ) 30 MG tablet Take 1 tablet by mouth once daily 10/13/23   Berglund, Laura H, MD  NIFEdipine  (PROCARDIA -XL/NIFEDICAL-XL) 30 MG 24 hr tablet TAKE ONE TABLET (30 MG) BY MOUTH ONCE DAILY. 11/02/23   Gollan, Timothy J, MD  nitroGLYCERIN (NITROSTAT) 0.4 MG SL tablet Place under the tongue.    [provider]  oxybutynin  (DITROPAN -XL) 10 MG 24 hr tablet Take 1 tablet by mouth once daily 11/16/23   Berglund, Laura H, MD  pantoprazole  (PROTONIX ) 40 MG tablet Take 1 tablet by mouth twice daily 12/02/23   Berglund, Laura H, MD  pravastatin  (PRAVACHOL ) 40 MG tablet Take 1 tablet (40 mg total) by mouth daily. 11/24/23   Gollan, Timothy J, MD  sacubitril -valsartan  (ENTRESTO ) 24-26 MG Take 1 tablet by mouth twice daily 04/21/23   Gollan, Timothy J, MD  SOTALOL  AF 80 MG TABS Take 1 tablet by mouth twice daily 05/22/23   Gollan, Timothy J, MD  spironolactone  (ALDACTONE ) 25 MG tablet Take 0.5 tablets (12.5 mg total) by mouth daily. 10/08/23 10/07/24  Lorene Lesley CROME, PA-C  traMADol (ULTRAM) 50 MG tablet Take 50 mg by mouth every 6 (six) hours as needed.    [provider]    Physical Exam: Vitals:   01/16/24 1137 01/16/24 1430 01/16/24 1530 01/16/24 1534  BP: (!)  120/90 (!) 157/118 (!) 135/117   Pulse: (!) 121 63 (!) 129 75  Resp: 20 (!) 22 15 17   Temp:      TempSrc: Oral     SpO2: 97% 97% 92% 93%    Constitutional: elderly woman in NAD Vitals:   01/16/24 1137 01/16/24 1430 01/16/24 1530 01/16/24 1534  BP: (!) 120/90 (!) 157/118 (!) 135/117   Pulse: (!) 121 63 (!) 129 75  Resp: 20 (!) 22 15 17   Temp:      TempSrc: Oral  SpO2: 97% 97% 92% 93%   Eyes: PERRL ENMT: Mucous membranes are moist. Neck: normal, supple Respiratory: clear to auscultation bilaterally, no wheezing, no crackles. Normal respiratory effort. No accessory muscle use.  Cardiovascular: irregularly irregular Abdomen: no tenderness, no masses palpated.  Musculoskeletal: no clubbing / cyanosis. No joint deformity upper and lower extremities. Good ROM, no contractures. Normal muscle tone.  Skin: no rashes, lesions, ulcers. No induration Neurologic: CN 2-12 grossly intact. Moving all extremities Psychiatric: Normal judgment and insight. Alert and oriented x 3. Normal mood.   Labs on Admission: I have personally reviewed following labs and imaging studies  CBC: Recent Labs  Lab 01/16/24 1051  WBC 7.4  HGB 14.5  HCT 43.5  MCV 90.1  PLT 301    Basic Metabolic Panel: Recent Labs  Lab 01/16/24 1051  NA 140  K 3.7  CL 106  CO2 22  GLUCOSE 112*  BUN 13  CREATININE 0.95  CALCIUM 9.1    GFR: Estimated Creatinine Clearance: 37.5 mL/min (by C-G formula based on SCr of 0.95 mg/dL).  Liver Function Tests: Recent Labs  Lab 01/16/24 1051  AST 18  ALT 8  ALKPHOS 77  BILITOT 0.4  PROT 7.2  ALBUMIN 4.4    Urine analysis:    Component Value Date/Time   BILIRUBINUR neg 12/14/2023 1357   PROTEINUR Negative 12/14/2023 1357   UROBILINOGEN 0.2 12/14/2023 1357   NITRITE neg 12/14/2023 1357   LEUKOCYTESUR Small (1+) ([place] Meraz) 12/14/2023 1357    Radiological Exams on Admission: DG Chest 2 View Result Date: 01/16/2024 CLINICAL DATA:  productive cough. EXAM: CHEST  - 2 VIEW COMPARISON:  December 14, 2023, September 25, 2023 FINDINGS: The cardiomediastinal silhouette is unchanged in contour.LEFT chest cardiac pacing device. Similar pulmonary artery prominence. RIGHT middle lobe platelike opacity, unchanged and consistent with scar. No pleural effusion. No pneumothorax. No acute pleuroparenchymal abnormality. Atherosclerotic calcifications. Degenerative changes of the thoracic spine. IMPRESSION: No acute cardiopulmonary abnormality. Electronically Signed   By: Corean Salter M.D.   On: 01/16/2024 11:21    EKG: Independently reviewed. afib  Assessment/Plan Principal Problem:   Malaise and fatigue    Assessment and Plan:  Malaise and Fatigue She describes about 1 week of nonspecific symptoms - night sweats, subjective fevers, poor appetite, diarrhea.  Constellation of symptoms sounds consistent with infectious etiology, possibly Zakariye Nee viral illness. Was started on doxycycline  for sinusitis outpatient, will hold additional abx for now   CXR without acute cardiopulm abnormality Negative covid, flu, RSV - will check RVP D dimer pending, TSH pending, troponins pending UA pending collection  Low threshold for CT chest abd pelvis with her persistent symptoms   Atrial Fibrillation with RVR Tachybradycardia Syndrome  S/p Pacemaker Placement Now rate controlled in 80's and 90's Resume home sotalol  and procardia  Currently in atrial fibrillation - in clinic 11/24/2023 had rare short episodes of atrial tach/fib - ? Whether some of her malaise above related to symptomatic afib. Will consult cardiology Per 09/2023 cards note, she's not on anticoagulation in setting of subclinical nature and low atrial fibrillation/flutter burden - would be worth discussion with cards with her in afib now  Coronary Artery Disease s/p PCI DES to proximal LAD and mid RCA (2013) Continue plavix , pravastatin   Diastolic Dysfunction Appears euvolemic Continue entresto ,  spironolactone  Follow I/O, caution with IVF  Hypothyroidism Synthroid   GERD  Hiatal Hernia Continue protonix   (Note she had 2 PPI's on her med list - follow final med rec, would discontinue nexium at discharge)  Follow  final med rec - as above, 2 PPI's on med list    DVT prophylaxis: lovenox   Code Status:   full  Family Communication:  none  Disposition Plan:   Patient is from:  home  Anticipated DC to:  home  Anticipated DC date:  Pending improvement  Anticipated DC barriers: Pending improvement  Consults called:  Cardiology consult ordered  Admission status:  obs   Severity of Illness: The appropriate patient status for this patient is OBSERVATION. Observation status is judged to be reasonable and necessary in order to provide the required intensity of service to ensure the patient's safety. The patient's presenting symptoms, physical exam findings, and initial radiographic and laboratory data in the context of their medical condition is felt to place them at decreased risk for further clinical deterioration. Furthermore, it is anticipated that the patient will be medically stable for discharge from the hospital within 2 midnights of admission.     Meliton Monte MD [facility]  How to contact the [facility] Attending or Consulting provider 7A - 7P or covering provider during after hours 7P -7A, for this patient?   Check the care team in [facility] and look for Idalie Canto) attending/consulting [facility] provider listed and b) the [facility] team listed Log into [email or web address] and use [place]'s universal password to access. If you do not have the password, please contact the hospital operator. Locate the [facility] provider you are looking for under [facility] and page to Raelynne Ludwick number that you can be directly reached. If you still have difficulty reaching the provider, please page the [facility] (Director on Call) for the Hospitalists listed on amion for assistance.  01/16/2024, 4:46 PM

## 2024-01-16 NOTE — Progress Notes (Signed)
 Pt said the IV put in for the CT was painful and she wants it removed. She does not want to be stuck anymore tonight. She said she will be willing tomorrow.

## 2024-01-17 ENCOUNTER — Observation Stay

## 2024-01-17 ENCOUNTER — Encounter: Payer: Self-pay | Admitting: Family Medicine

## 2024-01-17 DIAGNOSIS — R109 Unspecified abdominal pain: Secondary | ICD-10-CM | POA: Diagnosis not present

## 2024-01-17 DIAGNOSIS — I2510 Atherosclerotic heart disease of native coronary artery without angina pectoris: Secondary | ICD-10-CM

## 2024-01-17 DIAGNOSIS — I4891 Unspecified atrial fibrillation: Secondary | ICD-10-CM

## 2024-01-17 DIAGNOSIS — R5381 Other malaise: Secondary | ICD-10-CM | POA: Diagnosis not present

## 2024-01-17 DIAGNOSIS — I4719 Other supraventricular tachycardia: Secondary | ICD-10-CM

## 2024-01-17 DIAGNOSIS — I495 Sick sinus syndrome: Secondary | ICD-10-CM

## 2024-01-17 DIAGNOSIS — E785 Hyperlipidemia, unspecified: Secondary | ICD-10-CM

## 2024-01-17 DIAGNOSIS — A084 Viral intestinal infection, unspecified: Secondary | ICD-10-CM

## 2024-01-17 DIAGNOSIS — R5383 Other fatigue: Secondary | ICD-10-CM | POA: Diagnosis not present

## 2024-01-17 LAB — IRON AND TIBC
Iron: 67 ug/dL (ref 28–170)
Saturation Ratios: 24 % (ref 10.4–31.8)
TIBC: 279 ug/dL (ref 250–450)
UIBC: 212 ug/dL

## 2024-01-17 LAB — COMPREHENSIVE METABOLIC PANEL WITH GFR
ALT: 7 U/L (ref 0–44)
AST: 18 U/L (ref 15–41)
Albumin: 3.7 g/dL (ref 3.5–5.0)
Alkaline Phosphatase: 62 U/L (ref 38–126)
Anion gap: 10 (ref 5–15)
BUN: 12 mg/dL (ref 8–23)
CO2: 22 mmol/L (ref 22–32)
Calcium: 8.3 mg/dL — ABNORMAL LOW (ref 8.9–10.3)
Chloride: 110 mmol/L (ref 98–111)
Creatinine, Ser: 0.75 mg/dL (ref 0.44–1.00)
GFR, Estimated: >60 mL/min (ref >60)
Glucose, Bld: 101 mg/dL — ABNORMAL HIGH (ref 70–99)
Potassium: 3.4 mmol/L — ABNORMAL LOW (ref 3.5–5.1)
Sodium: 141 mmol/L (ref 135–145)
Total Bilirubin: 0.5 mg/dL (ref 0.0–1.2)
Total Protein: 5.8 g/dL — ABNORMAL LOW (ref 6.5–8.1)

## 2024-01-17 LAB — RESPIRATORY PANEL BY PCR

## 2024-01-17 LAB — CBC
HCT: 34.4 % — ABNORMAL LOW (ref 36.0–46.0)
Hemoglobin: 11.3 g/dL — ABNORMAL LOW (ref 12.0–15.0)
MCH: 29.7 pg (ref 26.0–34.0)
MCHC: 32.8 g/dL (ref 30.0–36.0)
MCV: 90.3 fL (ref 80.0–100.0)
Platelets: 229 K/uL (ref 150–400)
RBC: 3.81 MIL/uL — ABNORMAL LOW (ref 3.87–5.11)
RDW: 12.7 % (ref 11.5–15.5)
WBC: 6 K/uL (ref 4.0–10.5)
nRBC: 0 % (ref 0.0–0.2)

## 2024-01-17 LAB — MAGNESIUM: Magnesium: 1.6 mg/dL — ABNORMAL LOW (ref 1.7–2.4)

## 2024-01-17 LAB — FOLATE: Folate: 11.2 ng/mL (ref >5.9)

## 2024-01-17 LAB — PHOSPHORUS: Phosphorus: 2.9 mg/dL (ref 2.5–4.6)

## 2024-01-17 LAB — VITAMIN B12: Vitamin B-12: 227 pg/mL (ref 180–914)

## 2024-01-17 LAB — VITAMIN D 25 HYDROXY (VIT D DEFICIENCY, FRACTURES): Vit D, 25-Hydroxy: 64.16 ng/mL (ref 30–100)

## 2024-01-17 MED ORDER — ONDANSETRON HCL 4 MG/2ML IJ SOLN
4.0000 mg | Freq: Once | INTRAMUSCULAR | Status: AC
  Administered 2024-01-17: 4 mg via INTRAVENOUS
  Filled 2024-01-17: qty 2

## 2024-01-17 MED ORDER — MAGNESIUM SULFATE 4 GM/100ML IV SOLN
4.0000 g | Freq: Once | INTRAVENOUS | Status: AC
  Administered 2024-01-17: 4 g via INTRAVENOUS
  Filled 2024-01-17: qty 100

## 2024-01-17 MED ORDER — POTASSIUM CHLORIDE CRYS ER 20 MEQ PO TBCR
40.0000 meq | EXTENDED_RELEASE_TABLET | Freq: Once | ORAL | Status: AC
  Administered 2024-01-17: 40 meq via ORAL
  Filled 2024-01-17: qty 2

## 2024-01-17 MED ORDER — LORATADINE 10 MG PO TABS
10.0000 mg | ORAL_TABLET | Freq: Every day | ORAL | Status: DC
  Administered 2024-01-17 – 2024-01-18 (×2): 10 mg via ORAL
  Filled 2024-01-17 (×2): qty 1

## 2024-01-17 MED ORDER — ONDANSETRON HCL 4 MG/2ML IJ SOLN
4.0000 mg | Freq: Four times a day (QID) | INTRAMUSCULAR | Status: DC | PRN
Start: 2024-01-17 — End: 2024-01-18
  Administered 2024-01-17: 4 mg via INTRAVENOUS
  Filled 2024-01-17: qty 2

## 2024-01-17 MED ORDER — DOXYCYCLINE HYCLATE 100 MG PO TABS
100.0000 mg | ORAL_TABLET | Freq: Two times a day (BID) | ORAL | Status: DC
  Administered 2024-01-17 – 2024-01-18 (×3): 100 mg via ORAL
  Filled 2024-01-17 (×3): qty 1

## 2024-01-17 MED ORDER — POTASSIUM CHLORIDE 20 MEQ PO PACK: 20.0000 meq | PACK | ORAL | Status: DC

## 2024-01-17 MED ORDER — GUAIFENESIN ER 600 MG PO TB12
600.0000 mg | ORAL_TABLET | Freq: Two times a day (BID) | ORAL | Status: DC
  Administered 2024-01-17 – 2024-01-18 (×3): 600 mg via ORAL
  Filled 2024-01-17 (×3): qty 1

## 2024-01-17 NOTE — Consult Note (Signed)
 Cardiology Consult    Patient ID: Sandra Murphy MRN: [id number], DOB/AGE: [birth year]   Admit date: 01/16/2024 Date of Consult: 01/17/2024  Primary Physician: Justus Leita DEL, MD Primary Cardiologist: Evalene Lunger, MD  Electrophysiologist:  OLE ONEIDA HOLTS, MD  Requesting Provider: CHARM Sor, MD  Patient Profile    Sandra Murphy is a 89 y.o. female with a history of coronary artery disease, subclinical A-fib/flutter, atrial tachycardia, tachybradycardia syndrome status post permanent pacemaker, PVCs, frequent PACs, hypertension, hyperlipidemia, chronic HFpEF, prior DVT, and anxiety, who is being seen today for the evaluation of atrial arrhythmias in the setting of malaise, fatigue, fever, anorexia, and diarrhea at the request of Dr. Sor.  Past Medical History   Subjective  Past Medical History:  Diagnosis Date   Anxiety    CAD (coronary artery disease)    a. 2013 s/p PCI/DES to proximal LAD and mid RCA; b. 06/2016 Cath: Patent LAD/RCA stents, otw nonobs CAD; b. Chronic clopidogrel  rx.   Chronic heart failure with preserved ejection fraction (HFpEF) (HCC)    a. 09/2023 admit [facility] for HFpEF/atypical chest pain; b. 09/2023 Echo [facility]): EF >55%, nl RV fxn, mild MR/TR.   History of DVT (deep vein thrombosis) 2022   Hyperlipidemia    Hypertension    Hypothyroidism    IBS (irritable bowel syndrome)    Osteoarthritis    PAF (paroxysmal atrial fibrillation) (HCC)    a. Subclinical - detected on device monitoring-->sotalol .  No OAC given low-burden.   Premature atrial contractions    PVD (peripheral vascular disease)    SVT (supraventricular tachycardia)    Tachy-brady syndrome (HCC)    a. 10/2016 s/p BSX L311 Accolade MRI DC PPM.    Past Surgical History:  Procedure Laterality Date   ABDOMINAL HYSTERECTOMY     ANTERIOR FUSION CERVICAL SPINE     twice   CHOLECYSTECTOMY     CORONARY ANGIOPLASTY WITH STENT PLACEMENT     proximal LAD and mid RCA   LUMBAR DISC SURGERY     Pace  maker  11/06/2016   Medtronic Boston Scietific Pacemaker Model L311/ Serial [id number]   THYROIDECTOMY Left      Allergies  Allergies  Allergen Reactions   Penicillins Hives and Other (See Comments)   Sulfur Dioxide Nausea Only and Other (See Comments)   Other Other (See Comments)   Sulfa Antibiotics Nausea Only, Other (See Comments) and Diarrhea    HEADACHE        History of Present Illness   Subjective   89 y.o. female with a history of coronary artery disease, subclinical A-fib/flutter, atrial tachycardia, tachybradycardia syndrome status post permanent pacemaker, PVCs, frequent PACs, hypertension, hyperlipidemia, chronic HFpEF, prior DVT (2022), and anxiety.  She previously underwent stenting of the proximal LAD and mid RCA in 2013.  In October 2017, she underwent event monitoring in the setting of palpitations, which showed frequent atrial and ventricular ectopy, SVT, and nonsustained VT.  She had a 15% PAC burden and 2.4% PVC burden.  She required repeat diagnostic catheterization in May 2018 in the setting of chest pain, which showed patent LAD and RCA stents with otherwise nonobstructive disease.  In the setting of ongoing palpitations, 30-day event monitor in June 2018 showed sinus rhythm with paroxysmal atrial fibrillation.  She was seen by electrophysiology in the setting of tachybradycardia syndrome and underwent [employer] dual-chamber permanent pacemaker placement in September 2018.  She has been on sotalol  therapy for frequent PACs and atrial arrhythmias.  She has been  co-managed by Dr. Gollan in electrophysiology since January 2024.  Sandra Murphy was admitted to [facility] in August 2025 in the setting of episodic chest pain.  ECG unremarkable.  Troponins were normal and proBNP was elevated at 1221.  Pulmonary vascular congestion was noted on chest x-ray.  She was provided 1 dose of IV Lasix .  Echocardiogram showed an EF of greater than 55% with normal RV function, mild MR and TR.  She  was seen by cardiology, they did not feel that symptoms were ischemic in origin and recommendation was made for further workup of hiatal hernia.  His recent device interrogation in September 2025, showed less than 1% A-fib burden with longest episode of 1 hour and 16 minutes.    Over the past 6 days, pt has been experiencing fatigue, weakness, malaise, sinus congestion, cough, myalgias, nausea, diarrhea, subjective fevers, chills, and diaphoresis.  She has three friends that have all been dealing w/ sinus infections x 3 wks.  She denies any recent h/o chest pain or dyspnea.  She does note some ankle swelling at the end of the day and though she has access to PRN lasix , she has not needed it.  Due to progressive malaise, etc., she presented to the ED on 11/22.  On arrival, she was afebrile, normotensive, and tachycardic with heart rates into the 140s.  Initial ECG showed A paced and V sensed rhythm with incomplete right bundle block and runs of atrial tachycardia/PACs, with nonspecific ST and T changes.  Lab work unremarkable with H&H of 14.5/43.5, WBC 7.4, BUN/creatinine of 13/0.95, and troponin less than 15 x 2.  Procalcitonin less than 0.10.  D-dimer mildly elevated at 0.60.  TSH 0.802.  Respiratory panel negative.  Urinalysis with moderate leukocytes and rare bacteria.  Chest x-ray showed no acute cardiopulmonary abnormality.  CTA of the chest with contrast was ordered however, was unable to be performed b/c of IV access.    She was given an IV dilt 10mg  bolus and follow-up ECG at 1738 showed sinus tachycardia at 108 with frequent PACs, incomplete right bundle wrench block, and nonspecific ST/T changes.  HR's currently trending in 70's to 80 w/ predominantly A pacing, V sensing, and brief runs of PAT/freq PACs.  She cont to feel malaise and sinus congestion, but denies palps, chest pain, or dyspnea.  She is adamant that she does not want CTA. Meds, FH, SH, ROS    Subjective   Inpatient Medications     clopidogrel   75 mg Oral Daily   enoxaparin  (LOVENOX ) injection  40 mg Subcutaneous Q24H   levothyroxine   50 mcg Oral QAC breakfast   mirtazapine   30 mg Oral Daily   NIFEdipine   30 mg Oral Daily   oxybutynin   10 mg Oral Daily   pantoprazole   40 mg Oral BID   pravastatin   40 mg Oral Daily   sacubitril -valsartan   1 tablet Oral BID   sotalol   80 mg Oral BID   spironolactone   12.5 mg Oral Daily    Family History    Family History  Problem Relation Age of Onset   Cancer Mother    Hearing loss Father    Heart attack Father    Hypertension Sister    Heart disease Sister    Diabetes Sister    Pancreatic cancer Sister    Liver cancer Sister    Heart disease Brother    Lung disease Brother    She indicated that her mother is deceased. She indicated that her father  is deceased. She indicated that only one of her two sisters is alive. She indicated that her brother is deceased.   Social History    Social History   Socioeconomic History   Marital status: Widowed    Spouse name: Not on file   Number of children: 4   Years of education: Not on file   Highest education level: Not on file  Occupational History   Occupation: Retired  Tobacco Use   Smoking status: Never   Smokeless tobacco: Never  Vaping Use   Vaping status: Never Used  Substance and Sexual Activity   Alcohol use: No   Drug use: Never   Sexual activity: Not on file  Other Topics Concern   Not on file  Social History Narrative   1 stillborn child   Social Drivers of Corporate Investment Banker Strain: Low Risk (09/29/2023)   Received from [facility]   Overall Financial Resource Strain (CARDIA)    How hard is it for you to pay for the very basics like food, housing, medical care, and heating?: Not hard at all  Food Insecurity: No Food Insecurity (01/16/2024)   Hunger Vital Sign    Worried About Running Out of Food in the Last Year: Never true    Ran Out of Food in the Last Year: Never true   Transportation Needs: No Transportation Needs (01/16/2024)   PRAPARE - Administrator, Civil Service (Medical): No    Lack of Transportation (Non-Medical): No  Physical Activity: Insufficiently Active (01/14/2023)   Exercise Vital Sign    Days of Exercise per Week: 2 days    Minutes of Exercise per Session: 20 min  Stress: No Stress Concern Present (01/14/2023)   [employer] of Occupational Health - Occupational Stress Questionnaire    Feeling of Stress : Not at all  Social Connections: Moderately Integrated (01/16/2024)   Social Connection and Isolation Panel    Frequency of Communication with Friends and Family: More than three times a week    Frequency of Social Gatherings with Friends and Family: More than three times a week    Attends Religious Services: More than 4 times per year    Active Member of [employer] or Organizations: Yes    Attends Banker Meetings: 1 to 4 times per year    Marital Status: Widowed  Intimate Partner Violence: Not At Risk (01/16/2024)   Humiliation, Afraid, Rape, and Kick questionnaire    Fear of Current or Ex-Partner: No    Emotionally Abused: No    Physically Abused: No    Sexually Abused: No     Review of Systems    General:  +++ chills, +++ fever, +++ night sweats, +++ malaise, +++ myalgias, +++ generalized wkns, no weight changes.  Cardiovascular:  No chest pain, dyspnea on exertion, edema, orthopnea, palpitations, paroxysmal nocturnal dyspnea. Dermatological: No rash, lesions/masses Respiratory: +++ cough, no dyspnea Urologic: No hematuria, dysuria Abdominal:   +++ nausea, no vomiting, +++ diarrhea, no bright red blood per rectum, melena, or hematemesis Neurologic:  No visual changes, changes in mental status. All other systems reviewed and are otherwise negative except as noted above.     Exam, Labs, Other  Objective   Physical Exam    Blood pressure (!) 146/91, pulse 70, temperature 98.2 F (36.8 C),  temperature source Oral, resp. rate 18, height 5' (1.524 m), weight 76.2 kg, SpO2 93%.  General: Pleasant, NAD Psych: Normal affect. Neuro: Alert and oriented  X 3. Moves all extremities spontaneously. HEENT: Normal  Neck: Supple without bruits or JVD. Lungs:  Resp regular and unlabored, CTA. Heart: RRR no s3, s4, or murmurs. Abdomen: Soft, non-tender, non-distended, BS + x 4.  Extremities: No clubbing, cyanosis.  Trace bilat ankle edema. DP/PT2+, Radials 2+ and equal bilaterally.  Labs    Cardiac Enzymes High-sensitivity troponin T less than 15 x 2  Lab Results  Component Value Date   WBC 6.0 01/17/2024   HGB 11.3 (L) 01/17/2024   HCT 34.4 (L) 01/17/2024   MCV 90.3 01/17/2024   PLT 229 01/17/2024    Recent Labs  Lab 01/17/24 0645  NA 141  K 3.4*  CL 110  CO2 22  BUN 12  CREATININE 0.75  CALCIUM 8.3*  PROT 5.8*  BILITOT 0.5  ALKPHOS 62  ALT 7  AST 18  GLUCOSE 101*   Lab Results  Component Value Date   CHOL 173 10/08/2022   HDL 52 10/08/2022   LDLCALC 85 10/08/2022   TRIG 219 (H) 10/08/2022   Lab Results  Component Value Date   DDIMER 0.60 (H) 01/16/2024   Lab Results  Component Value Date   TSH 0.802 01/16/2024      Radiology Studies    DG Chest 2 View Result Date: 01/16/2024 CLINICAL DATA:  productive cough. EXAM: CHEST - 2 VIEW COMPARISON:  December 14, 2023, September 25, 2023 FINDINGS: The cardiomediastinal silhouette is unchanged in contour.LEFT chest cardiac pacing device. Similar pulmonary artery prominence. RIGHT middle lobe platelike opacity, unchanged and consistent with scar. No pleural effusion. No pneumothorax. No acute pleuroparenchymal abnormality. Atherosclerotic calcifications. Degenerative changes of the thoracic spine. IMPRESSION: No acute cardiopulmonary abnormality. Electronically Signed   By: Corean Salter M.D.   On: 01/16/2024 11:21      ECG & Cardiac Imaging    November 22 at 10:48 AM: A paced and V sensed rhythm with incomplete  right bundle block and runs of atrial tachycardia/PACs, with nonspecific ST and T changes - personally reviewed.  November 22 at 1738: Sinus tachycardia, 108, frequent PACs, incomplete right bundle branch block, nonspecific ST and T changes-personally reviewed  Assessment & Plan    1.  Atrial arrhythmias/PAF/PACs/atrial tachycardia/tachybradycardia syndrome: Long history of atrial arrhythmias including subclinical A-fib/flutter, atrial tachycardia, PSVT, and frequent PACs.  She has been chronically managed with sotalol  therapy and is followed by electrophysiology locally with most recent device interrogation in September 2025 showing several atrial high rate episodes, the longest of which being 1 hour and 16 minutes, with less than 1% A-fib burden.  In the setting of malaise, weakness, diarrhea, myalgias, fevers, chills, and presumed viral illness, she was notably tachycardic on admission with what appears to be an A paced V sensed rhythm with runs of atrial tachycardia/PACs.  She received one dose of IV dilt 10 mg and was placed on IVF. HRs currently stable in the 70's to 80's - predominantly A paced, V sensed w/ ongoing freq PACs and brief runs of PAT.  She is asymptomatic.  Suspect elevated rates on admission due to acute illness.  Cont home dose of sotalol .  Not anticoagulated historically due to low AFib burden, <1% on most recent interrogation in 10/2023, and no episodes >24 hrs.  Treat underlying illness.  2.  Presumed viral illness:  6 day h/o progressive fatigue, malaise, myalgias, sinus congestion, fevers, chills, diaphoresis, nausea, and diarrhea.  3 recent sick contacts.  Ongoing malaise, sinus congestion, etc.  Resp panel neg.  CXR unremarkable.  WBC nl.  Afebrile here.  UA w/ rare bacteria and moderate leukocytes but nitrite negative and no recent urinary symptoms.  Pt most concerned about sinus congestion.  Discussed presumed viral illness.  She is scheduled for CTA chest in setting of mildly  elevated d dimer of 0.60.  She does not want CTA.  Clinically, she is not hypoxic or req O2, and denies chest pain or dyspnea.  3.  Chronic HFpEF: Admitted to [facility] with volume excess in August 2025 with echocardiogram at that time showing EF greater than 55% with normal RV function and mild MR/TR.  Has lasix  at home but does not typically require.  Denies dyspnea, pnd, orthopnea.  Trace ankle edema on exam.  CXR unremarkable.  Follow closely w/ IVF - only receiving 50/hr.  Cont current meds including entresto  and spiro.  4.  Coronary artery disease: Status post remote LAD and RCA stenting with patent stents on catheterization in 2018.  She was admitted to [facility] in August 2025 in the setting of atypical chest pain with normal troponins.  Echo at that time showed normal LV function without regional wall motion abnormalities, and pain was not felt to be ischemic in origin.  She has not been having any chest pain.  Troponins normal this admission.  She is on chronic clopidogrel  therapy and statin.    5.  Primary hypertension: Pressure elevated this morning at 146/91, though for the most part she has been trending lower.  Trend pressures after morning medications.  Continue nifedipine , spironolactone , and Entresto .  6.  Hyperlipidemia: On pravastatin  therapy in the outpatient setting with an LDL of 85 in August 2024.  7.  Hypothyroidism: On levothyroxine  therapy with normal TSH of 0.802 this admission  8.  Hypokalemia:  K 3.4.  Giving 40 meq x 1 this AM.  Risk Assessment/Risk Scores:        New York  Heart Association (NYHA) Functional Class NYHA Class I  CHA2DS2-VASc Score = 6   This indicates a 9.7% annual risk of stroke. The patient's score is based upon: CHF History: 1 HTN History: 1 Diabetes History: 0 Stroke History: 0 Vascular Disease History: 1 Age Score: 2 Gender Score: 1     Signed, Lonni Meager, NP 01/17/2024, 7:51 AM  For questions or updates, please contact   Please  consult [email or web address] for contact info under Cardiology/STEMI.

## 2024-01-17 NOTE — Care Management Obs Status (Signed)
 MEDICARE OBSERVATION STATUS NOTIFICATION   Patient Details  Name: Sandra Murphy MRN: [id number] Date of Birth: [birth year]   Medicare Observation Status Notification Given:  Yes    Rojelio [name] 01/17/2024, 5:05 PM

## 2024-01-17 NOTE — Evaluation (Signed)
 Physical Therapy Evaluation Patient Details Name: Sandra Murphy MRN: [id number] DOB: Jan 01, 1934 Today's Date: 01/17/2024  History of Present Illness  Sandra Murphy is a 89 y.o. female with medical history significant of atrial fib (not on anticoagulation, tachybrady syndrome, CAD s/p PCI, HTN, HFpEF and other medical issues here with general malaise.  Clinical Impression  Patient noted to be in supine position at PT and OT arrival in room, for an initial PT evaluation due to a decline in functional status, with baseline mobility reported as modI, and currently requiring supervision for hallway ambulation. The patient is A&O x 4, presenting with good willingness to work with PT and goals of going home. The patient resides in a house and lives with daughter with family/friend support. There are 3 STE inside the residence. Gait was assessed with RW, limited by fatigue and endurance. The overall clinical impression is that the patient presents with moderate mobility limitations secondary to acute medical complications. Recommended skilled PT will address safety, mobility, and discharge planning. PT recommendation to d/c patient to SNF upon medical clearance.        If plan is discharge home, recommend the following: A little help with walking and/or transfers;A little help with bathing/dressing/bathroom;Help with stairs or ramp for entrance;Assist for transportation   Can travel by private vehicle        Equipment Recommendations None recommended by PT  Recommendations for Other Services       Functional Status Assessment Patient has had a recent decline in their functional status and demonstrates the ability to make significant improvements in function in a reasonable and predictable amount of time.     Precautions / Restrictions Precautions Precautions: Fall Restrictions Weight Bearing Restrictions Per Provider Order: No      Mobility  Bed Mobility Overal bed mobility: Needs  Assistance Bed Mobility: Supine to Sit     Supine to sit: Supervision, Modified independent (Device/Increase time)          Transfers Overall transfer level: Needs assistance Equipment used: Rolling walker (2 wheels) Transfers: Sit to/from Stand Sit to Stand: Supervision, Contact guard assist                Ambulation/Gait Ambulation/Gait assistance: Contact guard assist, Supervision Gait Distance (Feet): 250 Feet Assistive device: Rolling walker (2 wheels), None Gait Pattern/deviations: Step-through pattern, Narrow base of support, Trunk flexed, Decreased stride length, Shuffle       General Gait Details: vc needed for upright posture with RW; pt ambulated without AD for the last ~50 feet demonstrated increasing flexion and observable fatigue nearly after 10 feet after removing AD; Pt able to make it to chair safely  Stairs            Wheelchair Mobility     Tilt Bed    Modified Rankin (Stroke Patients Only)       Balance Overall balance assessment: Needs assistance Sitting-balance support: Feet supported Sitting balance-Leahy Scale: Good     Standing balance support: During functional activity, Reliant on assistive device for balance Standing balance-Leahy Scale: Good Standing balance comment: balance during functional acitvity is fair but static balance is good secondary to LE strength                             Pertinent Vitals/Pain Pain Assessment Pain Assessment: No/denies pain    Home Living Family/patient expects to be discharged to:: Private residence Living Arrangements: Children Available Help at Discharge:  Available PRN/intermittently;Family (Family in and out all day) Type of Home: House Home Access: Stairs to enter Entrance Stairs-Rails: Right Entrance Stairs-Number of Steps: 3   Home Layout: One level Home Equipment: Agricultural Consultant (2 wheels);Cane - single point;Shower seat - built in;Grab bars - tub/shower       Prior Function Prior Level of Function : Independent/Modified Independent             Mobility Comments: states that she is able to shop at [employer] for up to 15 min. using SPC; and uses rollator in house (educated to use rollator in community)       Extremity/Trunk Assessment        Lower Extremity Assessment Lower Extremity Assessment: Generalized weakness;Overall [facility] for tasks assessed    Cervical / Trunk Assessment Cervical / Trunk Assessment: Kyphotic  Communication   Communication Communication: No apparent difficulties    Cognition Arousal: Alert Behavior During Therapy: WFL for tasks assessed/performed   PT - Cognitive impairments: No apparent impairments                         Following commands: Intact       Cueing Cueing Techniques: Verbal cues     General Comments      Exercises     Assessment/Plan    PT Assessment Patient needs continued PT services  PT Problem List Decreased strength;Decreased range of motion;Decreased activity tolerance;Decreased balance;Decreased mobility;Decreased safety awareness       PT Treatment Interventions DME instruction;Gait training;Stair training;Functional mobility training;Therapeutic activities;Therapeutic exercise;Manual techniques;Patient/family education;Neuromuscular re-education;Balance training    PT Goals (Current goals can be found in the Care Plan section)  Acute Rehab PT Goals Patient Stated Goal: Pt wants to go home PT Goal Formulation: With patient/family Time For Goal Achievement: 02/07/24 Potential to Achieve Goals: Good    Frequency Min 2X/week     Co-evaluation PT/OT/SLP Co-Evaluation/Treatment: Yes Reason for Co-Treatment: Complexity of the patient's impairments (multi-system involvement);For patient/therapist safety PT goals addressed during session: Mobility/safety with mobility OT goals addressed during session: ADL's and self-care       AM-PAC PT 6 Clicks  Mobility  Outcome Measure Help needed turning from your back to your side while in a flat bed without using bedrails?: None Help needed moving from lying on your back to sitting on the side of a flat bed without using bedrails?: None Help needed moving to and from a bed to a chair (including a wheelchair)?: None Help needed standing up from a chair using your arms (e.g., wheelchair or bedside chair)?: A Little Help needed to walk in hospital room?: A Little Help needed climbing 3-5 steps with a railing? : A Little 6 Click Score: 21    End of Session Equipment Utilized During Treatment: Gait belt Activity Tolerance: Patient tolerated treatment well;Patient limited by fatigue Patient left: in chair;with call bell/phone within reach;with chair alarm set;with family/visitor present Nurse Communication: Mobility status PT Visit Diagnosis: Other abnormalities of gait and mobility (R26.89);Muscle weakness (generalized) (M62.81);Unsteadiness on feet (R26.81);Other symptoms and signs involving the nervous system (R29.898)    Time: 8850-8789 PT Time Calculation (min) (ACUTE ONLY): 21 min   Charges:   PT Evaluation $PT Eval Low Complexity: 1 Low   PT General Charges $$ ACUTE PT VISIT: 1 Visit         Sandra Murphy DPT, PT    Sandra Murphy 01/17/2024, 12:32 PM

## 2024-01-17 NOTE — Care Management Obs Status (Signed)
 MEDICARE OBSERVATION STATUS NOTIFICATION   Patient Details  Name: Sandra Murphy MRN: [id number] Date of Birth: Jun 17, 1933   Medicare Observation Status Notification Given:       Sandra Murphy 01/17/2024, 5:03 PM

## 2024-01-17 NOTE — Progress Notes (Signed)
 [facility] Progress Note  Patient: Sandra Murphy    FMW:[id number]  DOA: 01/16/2024     Date of Service: the patient was seen and examined on 01/17/2024  Chief Complaint  Patient presents with   Fever   Brief hospital course: Sandra Murphy is a 89 y.o. female with medical history significant of atrial fib (not on anticoagulation, tachybrady syndrome, CAD s/p PCI, HTN, HFpEF and other medical issues here with general malaise.  Since Monday patient is having off-and-on fever, nausea, with some phlegm, no vomiting.  Decreased appetite and feels tired and low energy.  Wednesday and Thursday she also noticed some diarrhea, maybe 3 episodes which resolved after Imodium Her daughter spoke to her PCP on 11/20 who called in doxycycline  for presumed sinusitis.  She took 4 doses of doxycycline , still feels nauseous.  Vague chest pain and back pain on Monday which resolved   Assessment and Plan:  # Sinusitis, presented with fatigue malaise and fever off and on other nonspecific symptoms for 1 week Patient took doxycycline  4 doses Started doxycycline  100 mg p.o. twice daily Mucinex  600 mg p.o. twice daily Robitussin DM as needed Claritin  10 mg p.o. daily Zofran  as needed for nausea/vomiting Continue PPI for GI prophylaxis   # Atrial Fibrillation with RVR Tachybradycardia Syndrome  S/p Pacemaker Placement Resume home sotalol  and procardia   Cardiology consulted, recommended to continue current treatment and keep potassium 4.0 and magnesium  2.0 ECGs showed multiple different atrial arrhythmias which have now resolved with fluids and one-time bolus of diltiazem .  Not on [facility] historically due to patient preference and low burden of atrial fibrillation  can give as needed AV nodal blockers for any sustained episodes tachyarrhythmia.  Cardiology signed off.   # Hypokalemia, potassium pleated. # Hypomagnesemia, mag repleted. Monitor electrolytes and replete as needed.    # Coronary Artery  Disease s/p PCI DES to proximal LAD and mid RCA (2013) Continue plavix , pravastatin    # Diastolic Dysfunction Appears euvolemic Continue entresto , spironolactone  Follow I/O, caution with IVF   # Hypothyroidism: Synthroid    # GERD  Hiatal Hernia: Continue protonix    Body mass index is 32.81 kg/m.  Interventions:  Diet: Regular diet DVT Prophylaxis: Subcutaneous Lovenox    Advance goals of care discussion: Full code  Family Communication: family was not present at bedside, at the time of interview.  The pt provided permission to discuss medical plan with the family. Opportunity was given to ask question and all questions were answered satisfactorily.   Disposition:  Pt is from Home, admitted with generalized weakness, still has weakness and nauseous, decreased appetite, which precludes a safe discharge. Discharge to SNF as per PT and OT eval recommended, when stable, most likely in 1 to 2 days. Follow TOC for dispo plan  Subjective: No significant events overnight.  Patient is feeling nauseous, having phlegm no vomiting, still has low appetite and feels generalized weakness and tired.  Physical Exam: General: NAD, lying comfortably Appear in no distress, affect appropriate Eyes: PERRLA ENT: Oral Mucosa Clear, moist  Neck: no JVD,  Cardiovascular: Regular rhythm, paced rhythm, no Murmur,  Respiratory: good respiratory effort, Bilateral Air entry equal and Decreased, no Crackles, no wheezes Abdomen: Bowel Sound present, Soft and no tenderness,  Skin: no rashes Extremities: no Pedal edema, no calf tenderness Neurologic: without any new focal findings Gait not checked due to patient safety concerns  Vitals:   01/17/24 0447 01/17/24 0728 01/17/24 0833 01/17/24 1029  BP: 136/74 (!) 146/91 (!) 146/78 118/61  Pulse: 72 70 71   Resp: 16 18 16    Temp: 97.9 F (36.6 C) 98.2 F (36.8 C) 97.8 F (36.6 C)   TempSrc:  Oral    SpO2: 93% 93% 93%   Weight:      Height:         Intake/Output Summary (Last 24 hours) at 01/17/2024 1426 Last data filed at 01/17/2024 0830 Gross per 24 hour  Intake 649.13 ml  Output 350 ml  Net 299.13 ml   Filed Weights   01/16/24 1932  Weight: 76.2 kg    Data Reviewed: I have personally reviewed and interpreted daily labs, tele strips, imagings as discussed above. I reviewed all nursing notes, pharmacy notes, vitals, pertinent old records I have discussed plan of care as described above with RN and patient/family.  CBC: Recent Labs  Lab 01/16/24 1051 01/16/24 1947 01/17/24 0645  WBC 7.4 7.1 6.0  HGB 14.5 13.5 11.3*  HCT 43.5 40.3 34.4*  MCV 90.1 90.8 90.3  PLT 301 275 229   Basic Metabolic Panel: Recent Labs  Lab 01/16/24 1051 01/16/24 1637 01/16/24 1947 01/17/24 0645 01/17/24 0912  NA 140  --   --  141  --   K 3.7  --   --  3.4*  --   CL 106  --   --  110  --   CO2 22  --   --  22  --   GLUCOSE 112*  --   --  101*  --   BUN 13  --   --  12  --   CREATININE 0.95  --  0.92 0.75  --   CALCIUM 9.1  --   --  8.3*  --   MG  --  1.6*  --   --  1.6*  PHOS  --   --   --   --  2.9    Studies: DG Abd 1 View Result Date: 01/17/2024 EXAM: 1 VIEW XRAY OF THE ABDOMEN 01/17/2024 12:23:00 PM COMPARISON: None available. CLINICAL HISTORY: Pain. FINDINGS: BOWEL: Nonobstructive bowel gas pattern. SOFT TISSUES: Right upper quadrant surgical clips. No opaque urinary calculi. BONES: Lumbar spine and cervical hardware noted. Degenerative changes of the thoracolumbar spine. No acute osseous abnormality. IMPRESSION: 1. No acute findings. Electronically signed by: Morgane Naveau MD 01/17/2024 12:28 PM EST RP Workstation: [id number]    Scheduled Meds:  clopidogrel   75 mg Oral Daily   doxycycline   100 mg Oral Q12H   enoxaparin  (LOVENOX ) injection  40 mg Subcutaneous Q24H   guaiFENesin   600 mg Oral BID   levothyroxine   50 mcg Oral QAC breakfast   loratadine   10 mg Oral Daily   mirtazapine   30 mg Oral Daily   NIFEdipine   30  mg Oral Daily   oxybutynin   10 mg Oral Daily   pantoprazole   40 mg Oral BID   pravastatin   40 mg Oral Daily   sacubitril -valsartan   1 tablet Oral BID   sotalol   80 mg Oral BID   spironolactone   12.5 mg Oral Daily   Continuous Infusions:  lactated ringers  50 mL/hr at 01/16/24 2004   magnesium  sulfate bolus IVPB 4 g (01/17/24 1418)   PRN Meds: acetaminophen  **OR** acetaminophen , guaiFENesin -dextromethorphan , iohexol , LORazepam , ondansetron  (ZOFRAN ) IV  Time spent: 35 minutes  Author: ELVAN SOR. MD [facility] 01/17/2024 2:26 PM  To reach On-call, see care teams to locate the attending and reach out to them via [email or web address]. If 7PM-7AM, please contact night-coverage If you still  have difficulty reaching the attending provider, please page the [facility] (Director on Call) for [facility] on amion for assistance.

## 2024-01-17 NOTE — Plan of Care (Signed)
   Problem: Health Behavior/Discharge Planning: Goal: Ability to manage health-related needs will improve Outcome: Progressing   Problem: Clinical Measurements: Goal: Ability to maintain clinical measurements within normal limits will improve Outcome: Progressing

## 2024-01-17 NOTE — Plan of Care (Signed)
   Problem: Health Behavior/Discharge Planning: Goal: Ability to manage health-related needs will improve Outcome: Progressing

## 2024-01-17 NOTE — Evaluation (Signed)
 Occupational Therapy Evaluation Patient Details Name: Sandra Murphy MRN: [id number] DOB: [birth year] Today's Date: 01/17/2024   History of Present Illness   Sandra Murphy is a 89 y.o. female with medical history significant of atrial fib (not on anticoagulation, tachybrady syndrome, CAD s/p PCI, HTN, HFpEF and other medical issues here with general malaise.     Clinical Impressions Pt was seen for OT/PT co evaluation this date. Prior to hospital admission, pt was MODI with ADLs, requiring assistance for meal prep and driving to appointments. Pt lives with her daughter in one level home with 3 steps to enter with a single railing. Pt reports she is alone some during the day but that family is in/out often. Pt presents with minimal deficits in balance, functional mobility/transfers, activity tolerance, and safety awareness affecting safe and optimal ADL completion. Pt currently completes bed mobility with MODI with use of bed rails, don/doffed socks w/ supervision, STS from the EOB with CGA, and CGA-progressing to supervision during room/hallway ambulation with use of RW.  VSS. Pt would benefit from skilled OT services to address noted impairments and functional limitations (see below for any additional details) in order to maximize safety and independence while minimizing future risk of falls, injury, and readmission. Do not anticipate the need for follow up OT services upon acute hospital DC.      If plan is discharge home, recommend the following:   A little help with walking and/or transfers;Assistance with cooking/housework;Assist for transportation;Help with stairs or ramp for entrance     Functional Status Assessment   Patient has had a recent decline in their functional status and demonstrates the ability to make significant improvements in function in a reasonable and predictable amount of time.     Equipment Recommendations   None recommended by OT     Recommendations for  Other Services         Precautions/Restrictions   Precautions Precautions: Fall Recall of Precautions/Restrictions: Intact Restrictions Weight Bearing Restrictions Per Provider Order: No     Mobility Bed Mobility Overal bed mobility: Needs Assistance Bed Mobility: Supine to Sit     Supine to sit: Supervision          Transfers Overall transfer level: Needs assistance Equipment used: Rolling walker (2 wheels) Transfers: Sit to/from Stand Sit to Stand: Supervision, Contact guard assist           General transfer comment: Initially CGA progressing to supervision with use of RW      Balance Overall balance assessment: Needs assistance Sitting-balance support: Feet supported Sitting balance-Leahy Scale: Good Sitting balance - Comments: Steady reaching outside BOS   Standing balance support: During functional activity, Reliant on assistive device for balance Standing balance-Leahy Scale: Good                             ADL either performed or assessed with clinical judgement   ADL Overall ADL's : Needs assistance/impaired Eating/Feeding: Set up;Sitting   Grooming: Contact guard assist;Sitting;Wash/dry hands;Wash/dry face;Oral care Grooming Details (indicate cue type and reason): Sink level grooming, CGA             Lower Body Dressing: Sitting/lateral leans;Supervision/safety   Toilet Transfer: Contact guard assist;Ambulation;Rolling walker (2 wheels) Toilet Transfer Details (indicate cue type and reason): Simulated toilet t/f         Functional mobility during ADLs: Contact guard assist;Rolling walker (2 wheels);Cueing for safety General ADL Comments: Simulated in home mobilitly,  ambulating with RW + CGA     Vision Baseline Vision/History: 1 Wears glasses       Perception         Praxis         Pertinent Vitals/Pain Pain Assessment Pain Assessment: No/denies pain     Extremity/Trunk Assessment Upper Extremity  Assessment Upper Extremity Assessment: Generalized weakness   Lower Extremity Assessment Lower Extremity Assessment: Defer to PT evaluation   Cervical / Trunk Assessment Cervical / Trunk Assessment: Kyphotic   Communication Communication Communication: No apparent difficulties   Cognition Arousal: Alert Behavior During Therapy: WFL for tasks assessed/performed Cognition: No apparent impairments                               Following commands: Intact       Cueing  General Comments   Cueing Techniques: Verbal cues  Grandaughter at bedside   Exercises Exercises: Other exercises Other Exercises Other Exercises: Edu: Role of OT eval, benefits of 4WW use during community mobility, safe ADL completion   Shoulder Instructions      Home Living Family/patient expects to be discharged to:: Private residence Living Arrangements: Children Available Help at Discharge: Available PRN/intermittently;Family Type of Home: House Home Access: Stairs to enter [employer] of Steps: 3 Entrance Stairs-Rails: Right Home Layout: One level     Bathroom Shower/Tub: Producer, Television/film/video: Standard Bathroom Accessibility: Yes How Accessible: Accessible via walker Home Equipment: Rolling Walker (2 wheels);Cane - single point;Shower seat - built in;Grab bars - tub/shower          Prior Functioning/Environment Prior Level of Function : Independent/Modified Independent             Mobility Comments: states that she is able to shop at [employer] for up to 15 min. using SPC; and uses rollator in house (educated to use rollator in community). ADLs Comments: MODI all ADLs, family assist with meals and driving within the community    OT Problem List: Decreased strength;Decreased activity tolerance;Impaired balance (sitting and/or standing);Decreased safety awareness;Decreased knowledge of use of DME or AE   OT Treatment/Interventions: Self-care/ADL  training;Therapeutic exercise;Energy conservation;DME and/or AE instruction;Balance training;Patient/family education      OT Goals(Current goals can be found in the care plan section)   Acute Rehab OT Goals Patient Stated Goal: Return home better OT Goal Formulation: With patient Time For Goal Achievement: 01/31/24 Potential to Achieve Goals: Good ADL Goals Pt Will Perform Grooming: with modified independence;standing Pt Will Perform Lower Body Dressing: with modified independence;sitting/lateral leans Pt Will Transfer to Toilet: with modified independence;ambulating Pt Will Perform Toileting - Clothing Manipulation and hygiene: with modified independence;sitting/lateral leans   OT Frequency:  Min 1X/week    Co-evaluation PT/OT/SLP Co-Evaluation/Treatment: Yes Reason for Co-Treatment: Complexity of the patient's impairments (multi-system involvement);For patient/therapist safety PT goals addressed during session: Mobility/safety with mobility OT goals addressed during session: ADL's and self-care      AM-PAC OT 6 Clicks Daily Activity     Outcome Measure Help from another person eating meals?: None Help from another person taking care of personal grooming?: None Help from another person toileting, which includes using toliet, bedpan, or urinal?: None Help from another person bathing (including washing, rinsing, drying)?: A Little Help from another person to put on and taking off regular upper body clothing?: None Help from another person to put on and taking off regular lower body clothing?: None 6 Click Score: 23  End of Session Equipment Utilized During Treatment: Gait belt;Rolling walker (2 wheels) Nurse Communication: Mobility status  Activity Tolerance: Patient tolerated treatment well Patient left: with call bell/phone within reach;in chair;with chair alarm set;with family/visitor present  OT Visit Diagnosis: Unsteadiness on feet (R26.81);Other abnormalities of  gait and mobility (R26.89);Muscle weakness (generalized) (M62.81)                Time: 8850-8788 OT Time Calculation (min): 22 min Charges:  OT General Charges $OT Visit: 1 Visit OT Evaluation $OT Eval Low Complexity: 1 Low  Larraine Colas M.S. OTR/L  01/17/24, 1:33 PM

## 2024-01-18 ENCOUNTER — Other Ambulatory Visit: Payer: Self-pay

## 2024-01-18 DIAGNOSIS — R5381 Other malaise: Secondary | ICD-10-CM | POA: Diagnosis not present

## 2024-01-18 DIAGNOSIS — R5383 Other fatigue: Secondary | ICD-10-CM | POA: Diagnosis not present

## 2024-01-18 LAB — CBC
HCT: 34.7 % — ABNORMAL LOW (ref 36.0–46.0)
Hemoglobin: 11.4 g/dL — ABNORMAL LOW (ref 12.0–15.0)
MCH: 30.2 pg (ref 26.0–34.0)
MCHC: 32.9 g/dL (ref 30.0–36.0)
MCV: 92 fL (ref 80.0–100.0)
Platelets: 215 K/uL (ref 150–400)
RBC: 3.77 MIL/uL — ABNORMAL LOW (ref 3.87–5.11)
RDW: 13 % (ref 11.5–15.5)
WBC: 5.1 K/uL (ref 4.0–10.5)
nRBC: 0 % (ref 0.0–0.2)

## 2024-01-18 LAB — BASIC METABOLIC PANEL WITH GFR
Anion gap: 8 (ref 5–15)
BUN: 11 mg/dL (ref 8–23)
CO2: 25 mmol/L (ref 22–32)
Calcium: 8.3 mg/dL — ABNORMAL LOW (ref 8.9–10.3)
Chloride: 110 mmol/L (ref 98–111)
Creatinine, Ser: 0.91 mg/dL (ref 0.44–1.00)
GFR, Estimated: >60 mL/min (ref >60)
Glucose, Bld: 95 mg/dL (ref 70–99)
Potassium: 4 mmol/L (ref 3.5–5.1)
Sodium: 143 mmol/L (ref 135–145)

## 2024-01-18 LAB — PHOSPHORUS: Phosphorus: 3.1 mg/dL (ref 2.5–4.6)

## 2024-01-18 LAB — MAGNESIUM: Magnesium: 2.4 mg/dL (ref 1.7–2.4)

## 2024-01-18 MED ORDER — CYANOCOBALAMIN 1000 MCG/ML IJ SOLN
1000.0000 ug | Freq: Once | INTRAMUSCULAR | Status: AC
  Administered 2024-01-18: 1000 ug via INTRAMUSCULAR
  Filled 2024-01-18: qty 1

## 2024-01-18 MED ORDER — LORATADINE 10 MG PO TABS
10.0000 mg | ORAL_TABLET | Freq: Every day | ORAL | 0 refills | Status: AC
  Filled 2024-01-18: qty 10, 10d supply, fill #0

## 2024-01-18 MED ORDER — CYANOCOBALAMIN 1000 MCG PO TABS
1000.0000 ug | ORAL_TABLET | Freq: Every day | ORAL | 1 refills | Status: AC
  Filled 2024-01-18: qty 90, 90d supply, fill #0

## 2024-01-18 MED ORDER — DEXTROMETHORPHAN-GUAIFENESIN 20-200 MG/20ML PO LIQD
10.0000 mL | ORAL | 0 refills | Status: AC | PRN
  Filled 2024-01-18: qty 236, 3d supply, fill #0

## 2024-01-18 MED ORDER — GUAIFENESIN ER 600 MG PO TB12
600.0000 mg | ORAL_TABLET | Freq: Two times a day (BID) | ORAL | 0 refills | Status: AC
  Filled 2024-01-18: qty 10, 5d supply, fill #0

## 2024-01-18 MED ORDER — VITAMIN B-12 1000 MCG PO TABS: 1000.0000 ug | ORAL_TABLET | Freq: Every day | ORAL | Status: DC

## 2024-01-18 NOTE — Progress Notes (Signed)
 Mobility Specialist Progress Note:    01/18/24 0837  Mobility  Activity Ambulated with assistance;Pivoted/transferred from bed to chair  Level of Assistance Contact guard assist, steadying assist  Assistive Device Front wheel walker  Distance Ambulated (ft) 24 ft  Range of Motion/Exercises Active;All extremities  Activity Response Tolerated well  Mobility visit 1 Mobility  Mobility Specialist Start Time (ACUTE ONLY) [id number]  Mobility Specialist Stop Time (ACUTE ONLY) 0819  Mobility Specialist Time Calculation (min) (ACUTE ONLY) 11 min   Pt received in bed, agreeable to assistance to bathroom and transfer to chair however not agreeable to further mobility at this time. Required CGA to stand and SBA to ambulate with RW. Tolerated well, asx throughout. Left pt in chair, alarm on and belongings in reach. Will attempt further ambulation/mobility at a later time, all needs met.  Sherrilee Ditty Mobility Specialist Please contact via Special Educational Needs Teacher or  Rehab office at [phone]

## 2024-01-18 NOTE — TOC CM/SW Note (Signed)
 Transition of Care [facility]) - Inpatient Brief Assessment   Patient Details  Name: Sandra Murphy MRN: [id number] Date of Birth: 01-12-1934  Transition of Care [facility]) CM/SW Contact:    Corean ONEIDA Haddock, RN Phone Number: 01/18/2024, 1:33 PM   Clinical Narrative:   Transition of Care [facility]) Screening Note   Patient Details  Name: [name] Date of Birth: 1933/02/28   Transition of Care [facility]) CM/SW Contact:    Corean ONEIDA Haddock, RN Phone Number: 01/18/2024, 1:33 PM    Transition of Care Department [facility]) has reviewed patient and no TOC needs have been identified at this time.  If new patient transition needs arise, please place a TOC consult.  PT recommending outpatient PT. Met with patient at bedside and reviewed recs.  She declines at this time.  MD updated.  Patient states daughter to transport at discharge   Transition of Care Asessment: Insurance and Status: Insurance coverage has been reviewed Patient has primary care physician: Yes     Prior/Current Home Services: No current home services Social Drivers of Health Review: SDOH reviewed no interventions necessary Readmission risk has been reviewed: No (obs status, no score generated) Transition of care needs: no transition of care needs at this time

## 2024-01-18 NOTE — Plan of Care (Signed)

## 2024-01-18 NOTE — Discharge Summary (Signed)
 [facility] Discharge Summary   Patient: Sandra Murphy FMW:[id number]  PCP: Justus Leita DEL, MD  Date of admission: 01/16/2024   Date of discharge:  01/18/2024     Discharge Diagnoses:  Principal Problem:   Malaise and fatigue   Admitted From: Home Disposition:  Home   Recommendations for Outpatient Follow-up:  F/u with PCP in 1 wk F/u with Cardio as per schedule Follow up LABS/TEST:  Repeat B12 level in btw 3-6 months   Diet recommendation: Cardiac diet  Activity: The patient is advised to gradually reintroduce usual activities, as tolerated  Discharge Condition: stable  Code Status: Full code   History of present illness: As per the H and P dictated on admission.  Hospital Course:  Sandra Murphy is a 89 y.o. female with medical history significant of atrial fib (not on anticoagulation, tachybrady syndrome, CAD s/p PCI, HTN, HFpEF and other medical issues here with general malaise.  Since Monday patient is having off-and-on fever, nausea, with some phlegm, no vomiting.  Decreased appetite and feels tired and low energy.  Wednesday and Thursday she also noticed some diarrhea, maybe 3 episodes which resolved after Imodium Her daughter spoke to her PCP on 11/20 who called in doxycycline  for presumed sinusitis.  She took 4 doses of doxycycline , still feels nauseous.  Vague chest pain and back pain on Monday which resolved     Assessment and Plan:   # Sinusitis, presented with fatigue malaise and fever off and on other nonspecific symptoms for 1 week. Patient took doxycycline  4 doses. Started doxycycline  100 mg p.o. twice daily. Mucinex  600 mg p.o. twice daily. Robitussin DM as needed Claritin  10 mg p.o. daily. S/p Zofran  as needed for nausea/vomiting and s/p PPI for GI prophylaxis.  11/24 patient is feeling fine, all her symptoms almost resolved.  Patient looked is stable to discharge and she agreed with the discharge planning.  Patient was advised to continue doxycycline   at home to finish the course.  Continue Mucinex  twice daily for 5 days and Robitussin DM as needed.  Prescribed Claritin  10 mg p.o. daily for 10 days.  Follow-up with PCP for further management as an outpatient.    # Atrial Fibrillation with RVR Tachybradycardia Syndrome  S/p Pacemaker Placement Resume home sotalol  and procardia   Cardiology consulted, recommended to continue current treatment and keep potassium 4.0 and magnesium  2.0 ECGs showed multiple different atrial arrhythmias which have now resolved with fluids and one-time bolus of diltiazem .  Not on [facility] historically due to patient preference and low burden of atrial fibrillation  can give as needed AV nodal blockers for any sustained episodes tachyarrhythmia.  Cardiology signed off. Patient remained stable to DC, recommended to follow with cardiology as an outpatient as per schedule.   # Hypokalemia, potassium pleated.  Resolved # Hypomagnesemia, mag repleted.  Resolved  # Coronary Artery Disease s/p PCI DES to proximal LAD and mid RCA (2013) Continue plavix , pravastatin    # Diastolic Dysfunction: Appears euvolemic. Continue entresto , spironolactone  # Hypothyroidism: Synthroid  # GERD  Hiatal Hernia: Continue protonix      Body mass index is 32.81 kg/m.  Nutrition Interventions:   Patient was ambulatory without any assistance.  On the day of the discharge the patient's vitals were stable, and no other acute medical condition were reported by patient. the patient was felt safe to be discharge at Home.  Consultants: Cardiology Procedures: None  Discharge Exam: General: Appear in no distress, Oral Mucosa Clear, moist. Cardiovascular: S1 and S2 Present, no  Murmur, Respiratory: normal respiratory effort, Bilateral Air entry present and no Crackles, no wheezes Abdomen: Bowel Sound present, Soft and no tenderness. Extremities: no Pedal edema, no calf tenderness Neurology: alert and oriented to time, place, and person affect  appropriate.  Filed Weights   01/16/24 1932  Weight: 76.2 kg   Vitals:   01/18/24 0435 01/18/24 0730  BP: 128/73 138/80  Pulse: 73 71  Resp: 16 16  Temp: 98 F (36.7 C) 98 F (36.7 C)  SpO2: 94% 100%    DISCHARGE MEDICATION: Allergies as of 01/18/2024       Reactions   Penicillins Hives, Other (See Comments)   Sulfur Dioxide Nausea Only, Other (See Comments)   Other Other (See Comments)   Sulfa Antibiotics Nausea Only, Other (See Comments), Diarrhea   HEADACHE         Medication List     TAKE these medications    alendronate  70 MG tablet Commonly known as: FOSAMAX  Take 1 tablet by mouth once a week   clopidogrel  75 MG tablet Commonly known as: PLAVIX  Take 1 tablet (75 mg total) by mouth daily.   cyanocobalamin  1000 MCG tablet Take 1 tablet (1,000 mcg total) by mouth daily. Start taking on: January 19, 2024   doxycycline  100 MG tablet Commonly known as: VIBRA -TABS Take 1 tablet (100 mg total) by mouth 2 (two) times daily for 7 days.   Entresto  24-26 MG Generic drug: sacubitril -valsartan  Take 1 tablet by mouth twice daily   esomeprazole 20 MG capsule Commonly known as: NEXIUM Take 20 mg by mouth daily at 12 noon.   guaiFENesin  600 MG 12 hr tablet Commonly known as: MUCINEX  Take 1 tablet (600 mg total) by mouth 2 (two) times daily for 5 days.   guaiFENesin -dextromethorphan  100-10 MG/5ML syrup Commonly known as: ROBITUSSIN DM Take 5 mLs by mouth every 4 (four) hours as needed for cough.   levothyroxine  50 MCG tablet Commonly known as: SYNTHROID  TAKE 1 TABLET BY MOUTH ONCE DAILY BEFORE BREAKFAST   loratadine  10 MG tablet Commonly known as: CLARITIN  Take 1 tablet (10 mg total) by mouth daily after supper for 10 days.   LORazepam  0.5 MG tablet Commonly known as: ATIVAN  Take 1 tablet (0.5 mg total) by mouth 2 (two) times daily as needed. for anxiety   mirtazapine  30 MG tablet Commonly known as: REMERON  Take 1 tablet by mouth once daily    NIFEdipine  30 MG 24 hr tablet Commonly known as: PROCARDIA -XL/NIFEDICAL-XL TAKE ONE TABLET (30 MG) BY MOUTH ONCE DAILY.   nitroGLYCERIN 0.4 MG SL tablet Commonly known as: NITROSTAT Place under the tongue.   oxybutynin  10 MG 24 hr tablet Commonly known as: DITROPAN -XL Take 1 tablet by mouth once daily   pantoprazole  40 MG tablet Commonly known as: PROTONIX  Take 1 tablet by mouth twice daily   pravastatin  40 MG tablet Commonly known as: PRAVACHOL  Take 1 tablet (40 mg total) by mouth daily.   SOTALOL  AF 80 MG Tabs Take 1 tablet by mouth twice daily   spironolactone  25 MG tablet Commonly known as: ALDACTONE  Take 0.5 tablets (12.5 mg total) by mouth daily.   traMADol 50 MG tablet Commonly known as: ULTRAM Take 50 mg by mouth every 6 (six) hours as needed.   Vitamin D3 250 MCG (10000 UT) capsule Take by mouth.       Allergies  Allergen Reactions   Penicillins Hives and Other (See Comments)   Sulfur Dioxide Nausea Only and Other (See Comments)   Other Other (  See Comments)   Sulfa Antibiotics Nausea Only, Other (See Comments) and Diarrhea    HEADACHE    Discharge Instructions     Ambulatory referral to Physical Therapy   Complete by: As directed    Call MD for:  difficulty breathing, headache or visual disturbances   Complete by: As directed    Call MD for:  extreme fatigue   Complete by: As directed    Call MD for:  persistant dizziness or light-headedness   Complete by: As directed    Call MD for:  persistant nausea and vomiting   Complete by: As directed    Call MD for:  severe uncontrolled pain   Complete by: As directed    Call MD for:  temperature >100.4   Complete by: As directed    Diet - low sodium heart healthy   Complete by: As directed    Discharge instructions   Complete by: As directed    F/u with PCP in 1 wk F/u with Cardio as per schedule Repeat B12 level in btw 3-6 months   Increase activity slowly   Complete by: As directed         The results of significant diagnostics from this hospitalization (including imaging, microbiology, ancillary and laboratory) are listed below for reference.    Significant Diagnostic Studies: DG Abd 1 View Result Date: 01/17/2024 EXAM: 1 VIEW XRAY OF THE ABDOMEN 01/17/2024 12:23:00 PM COMPARISON: None available. CLINICAL HISTORY: Pain. FINDINGS: BOWEL: Nonobstructive bowel gas pattern. SOFT TISSUES: Right upper quadrant surgical clips. No opaque urinary calculi. BONES: Lumbar spine and cervical hardware noted. Degenerative changes of the thoracolumbar spine. No acute osseous abnormality. IMPRESSION: 1. No acute findings. Electronically signed by: Morgane Naveau MD 01/17/2024 12:28 PM EST RP Workstation: [id number]   DG Chest 2 View Result Date: 01/16/2024 CLINICAL DATA:  productive cough. EXAM: CHEST - 2 VIEW COMPARISON:  December 14, 2023, September 25, 2023 FINDINGS: The cardiomediastinal silhouette is unchanged in contour.LEFT chest cardiac pacing device. Similar pulmonary artery prominence. RIGHT middle lobe platelike opacity, unchanged and consistent with scar. No pleural effusion. No pneumothorax. No acute pleuroparenchymal abnormality. Atherosclerotic calcifications. Degenerative changes of the thoracic spine. IMPRESSION: No acute cardiopulmonary abnormality. Electronically Signed   By: Corean Salter M.D.   On: 01/16/2024 11:21    Microbiology: Recent Results (from the past 240 hours)  Resp panel by RT-PCR (RSV, Flu A&B, Covid) Anterior Nasal Swab     Status: None   Collection Time: 01/16/24 10:49 AM   Specimen: Anterior Nasal Swab  Result Value Ref Range Status   SARS Coronavirus 2 by RT PCR NEGATIVE NEGATIVE Final    Comment: (NOTE) SARS-CoV-2 target nucleic acids are NOT DETECTED.  The SARS-CoV-2 RNA is generally detectable in upper respiratory specimens during the acute phase of infection. The lowest concentration of SARS-CoV-2 viral copies this assay can detect is 138  copies/mL. A negative result does not preclude SARS-Cov-2 infection and should not be used as the sole basis for treatment or other patient management decisions. A negative result may occur with  improper specimen collection/handling, submission of specimen other than nasopharyngeal swab, presence of viral mutation(s) within the areas targeted by this assay, and inadequate number of viral copies(<138 copies/mL). A negative result must be combined with clinical observations, patient history, and epidemiological information. The expected result is Negative.  Fact Sheet for Patients:  bloggercourse.com  Fact Sheet for Healthcare Providers:  seriousbroker.it  This test is no t yet approved or cleared by the  [place]  FDA and  has been authorized for detection and/or diagnosis of SARS-CoV-2 by FDA under an Emergency Use Authorization (EUA). This EUA will remain  in effect (meaning this test can be used) for the duration of the COVID-19 declaration under Section 564(b)(1) of the Act, 21 U.S.C.section 360bbb-3(b)(1), unless the authorization is terminated  or revoked sooner.       Influenza A by PCR NEGATIVE NEGATIVE Final   Influenza B by PCR NEGATIVE NEGATIVE Final    Comment: (NOTE) The Xpert Xpress SARS-CoV-2/FLU/RSV plus assay is intended as an aid in the diagnosis of influenza from Nasopharyngeal swab specimens and should not be used as a sole basis for treatment. Nasal washings and aspirates are unacceptable for Xpert Xpress SARS-CoV-2/FLU/RSV testing.  Fact Sheet for Patients: bloggercourse.com  Fact Sheet for Healthcare Providers: seriousbroker.it  This test is not yet approved or cleared by the [place]  FDA and has been authorized for detection and/or diagnosis of SARS-CoV-2 by FDA under an Emergency Use Authorization (EUA). This EUA will remain in effect (meaning  this test can be used) for the duration of the COVID-19 declaration under Section 564(b)(1) of the Act, 21 U.S.C. section 360bbb-3(b)(1), unless the authorization is terminated or revoked.     Resp Syncytial Virus by PCR NEGATIVE NEGATIVE Final    Comment: (NOTE) Fact Sheet for Patients: bloggercourse.com  Fact Sheet for Healthcare Providers: seriousbroker.it  This test is not yet approved or cleared by the [place]  FDA and has been authorized for detection and/or diagnosis of SARS-CoV-2 by FDA under an Emergency Use Authorization (EUA). This EUA will remain in effect (meaning this test can be used) for the duration of the COVID-19 declaration under Section 564(b)(1) of the Act, 21 U.S.C. section 360bbb-3(b)(1), unless the authorization is terminated or revoked.  Performed at [facility], [address] [place], [place] [address]   Respiratory (~20 pathogens) panel by PCR     Status: None   Collection Time: 01/16/24  4:37 PM   Specimen: Nasopharyngeal Swab; Respiratory  Result Value Ref Range Status   Adenovirus NOT DETECTED NOT DETECTED Final   Coronavirus 229E NOT DETECTED NOT DETECTED Final    Comment: (NOTE) The Coronavirus on the Respiratory Panel, DOES NOT test for the novel  Coronavirus (2019 nCoV)    Coronavirus HKU1 NOT DETECTED NOT DETECTED Final   Coronavirus NL63 NOT DETECTED NOT DETECTED Final   Coronavirus OC43 NOT DETECTED NOT DETECTED Final   Metapneumovirus NOT DETECTED NOT DETECTED Final   Rhinovirus / Enterovirus NOT DETECTED NOT DETECTED Final   Influenza A NOT DETECTED NOT DETECTED Final   Influenza B NOT DETECTED NOT DETECTED Final   Parainfluenza Virus 1 NOT DETECTED NOT DETECTED Final   Parainfluenza Virus 2 NOT DETECTED NOT DETECTED Final   Parainfluenza Virus 3 NOT DETECTED NOT DETECTED Final   Parainfluenza Virus 4 NOT DETECTED NOT DETECTED Final   Respiratory Syncytial Virus NOT  DETECTED NOT DETECTED Final   Bordetella pertussis NOT DETECTED NOT DETECTED Final   Bordetella Parapertussis NOT DETECTED NOT DETECTED Final   Chlamydophila pneumoniae NOT DETECTED NOT DETECTED Final   Mycoplasma pneumoniae NOT DETECTED NOT DETECTED Final    Comment: Performed at [facility], [address] [place], [place] [address]     Labs: CBC: Recent Labs  Lab 01/16/24 1051 01/16/24 1947 01/17/24 0645 01/18/24 0504  WBC 7.4 7.1 6.0 5.1  HGB 14.5 13.5 11.3* 11.4*  HCT 43.5 40.3 34.4* 34.7*  MCV 90.1  90.8 90.3 92.0  PLT 301 275 229 215   Basic Metabolic Panel: Recent Labs  Lab 01/16/24 1051 01/16/24 1637 01/16/24 1947 01/17/24 0645 01/17/24 0912 01/18/24 0504  NA 140  --   --  141  --  143  K 3.7  --   --  3.4*  --  4.0  CL 106  --   --  110  --  110  CO2 22  --   --  22  --  25  GLUCOSE 112*  --   --  101*  --  95  BUN 13  --   --  12  --  11  CREATININE 0.95  --  0.92 0.75  --  0.91  CALCIUM 9.1  --   --  8.3*  --  8.3*  MG  --  1.6*  --   --  1.6* 2.4  PHOS  --   --   --   --  2.9 3.1   Liver Function Tests: Recent Labs  Lab 01/16/24 1051 01/17/24 0645  AST 18 18  ALT 8 7  ALKPHOS 77 62  BILITOT 0.4 0.5  PROT 7.2 5.8*  ALBUMIN 4.4 3.7   Recent Labs  Lab 01/16/24 1051  LIPASE 37   No results for input(s): AMMONIA in the last 168 hours. Cardiac Enzymes: No results for input(s): CKTOTAL, CKMB, CKMBINDEX, TROPONINI in the last 168 hours. BNP (last 3 results) No results for input(s): BNP in the last 8760 hours. CBG: No results for input(s): GLUCAP in the last 168 hours.  Time spent: 35 minutes  Signed:  Elvan Sor  [facility] 01/18/2024 11:14 AM

## 2024-01-19 ENCOUNTER — Telehealth: Payer: Self-pay

## 2024-01-19 NOTE — Transitions of Care (Post Inpatient/ED Visit) (Unsigned)
   01/19/2024  Name: Sandra Murphy MRN: [id number] DOB: 11/06/1933  Today's TOC FU Call Status: Today's TOC FU Call Status:: Unsuccessful Call (2nd Attempt) Unsuccessful Call (1st Attempt) Date: 01/19/24 Unsuccessful Call (2nd Attempt) Date: 01/19/24  Attempted to reach the patient regarding the most recent Inpatient/ED visit.  Follow Up Plan: Additional outreach attempts will be made to reach the patient to complete the Transitions of Care (Post Inpatient/ED visit) call.   Signature Julian Lemmings, LPN [facility] Nurse Health Advisor Direct Dial [phone]

## 2024-01-19 NOTE — Transitions of Care (Post Inpatient/ED Visit) (Unsigned)
   01/19/2024  Name: Sandra Murphy MRN: [id number] DOB: 26-Jun-1933  Today's TOC FU Call Status: Today's TOC FU Call Status:: Unsuccessful Call (1st Attempt) Unsuccessful Call (1st Attempt) Date: 01/19/24  Attempted to reach the patient regarding the most recent Inpatient/ED visit.  Follow Up Plan: Additional outreach attempts will be made to reach the patient to complete the Transitions of Care (Post Inpatient/ED visit) call.   Signature Julian Lemmings, LPN [facility] Nurse Health Advisor Direct Dial [phone]

## 2024-01-20 ENCOUNTER — Ambulatory Visit: Admitting: Internal Medicine

## 2024-01-20 ENCOUNTER — Telehealth: Payer: Self-pay | Admitting: Pharmacy Technician

## 2024-01-20 ENCOUNTER — Telehealth: Payer: Self-pay

## 2024-01-20 ENCOUNTER — Other Ambulatory Visit (HOSPITAL_COMMUNITY): Payer: Self-pay

## 2024-01-20 NOTE — Progress Notes (Deleted)
 Date:  01/20/2024   Name:  Sandra Murphy   DOB:  1933/10/09   MRN:  [id number]   Chief Complaint: No chief complaint on file.  HPI  Review of Systems   Lab Results  Component Value Date   NA 143 01/18/2024   K 4.0 01/18/2024   CO2 25 01/18/2024   GLUCOSE 95 01/18/2024   BUN 11 01/18/2024   CREATININE 0.91 01/18/2024   CALCIUM 8.3 (L) 01/18/2024   EGFR 62 12/14/2023   GFRNONAA >60 01/18/2024   Lab Results  Component Value Date   CHOL 173 10/08/2022   HDL 52 10/08/2022   LDLCALC 85 10/08/2022   TRIG 219 (H) 10/08/2022   CHOLHDL 3.3 10/08/2022   Lab Results  Component Value Date   TSH 0.802 01/16/2024   No results found for: HGBA1C Lab Results  Component Value Date   WBC 5.1 01/18/2024   HGB 11.4 (L) 01/18/2024   HCT 34.7 (L) 01/18/2024   MCV 92.0 01/18/2024   PLT 215 01/18/2024   Lab Results  Component Value Date   ALT 7 01/17/2024   AST 18 01/17/2024   ALKPHOS 62 01/17/2024   BILITOT 0.5 01/17/2024   Lab Results  Component Value Date   VD25OH 64.16 01/17/2024     Patient Active Problem List   Diagnosis Date Noted   Malaise and fatigue 01/16/2024   Chest pain 01/11/2024   Fever 01/11/2024   Essential hypertension 10/08/2022   Tachy-brady syndrome (HCC) 12/04/2021   GERD without esophagitis 12/04/2021   Generalized anxiety disorder 12/04/2021   Degenerative disc disease, lumbar 12/04/2021   OAB (overactive bladder) 12/04/2021   Acquired hypothyroidism 12/04/2021   Mixed hyperlipidemia 08/08/2021   Hx of deep venous thrombosis 08/08/2021   Osteoporosis 08/08/2021   PVD (peripheral vascular disease) 08/08/2021   Hx of heart artery stent 08/08/2021   Hiatal hernia 08/08/2021   Asthma 11/14/2020   Atherosclerotic heart disease of native coronary artery without angina pectoris 01/10/2016   PVCs (premature ventricular contractions) 01/10/2016    Allergies  Allergen Reactions   Penicillins Hives and Other (See Comments)   Sulfur Dioxide  Nausea Only and Other (See Comments)   Other Other (See Comments)   Sulfa Antibiotics Nausea Only, Other (See Comments) and Diarrhea    HEADACHE     Past Surgical History:  Procedure Laterality Date   ABDOMINAL HYSTERECTOMY     ANTERIOR FUSION CERVICAL SPINE     twice   CHOLECYSTECTOMY     CORONARY ANGIOPLASTY WITH STENT PLACEMENT     proximal LAD and mid RCA   LUMBAR DISC SURGERY     Pace maker  11/06/2016   Medtronic [employer] Scietific Pacemaker Model L311/ Serial [id number]   THYROIDECTOMY Left     Social History   Tobacco Use   Smoking status: Never   Smokeless tobacco: Never  Vaping Use   Vaping status: Never Used  Substance Use Topics   Alcohol use: No   Drug use: Never     Medication list has been reviewed and updated.  No outpatient medications have been marked as taking for the 01/20/24 encounter (Appointment) with Justus Leita DEL, MD.       12/14/2023    1:28 PM 09/07/2023   10:12 AM 04/09/2023   10:04 AM 10/08/2022   10:02 AM  GAD 7 : Generalized Anxiety Score  Nervous, Anxious, on Edge 0 0 0 0  Control/stop worrying 0 0 0 0  Worry too much -  different things 0 0 0 0  Trouble relaxing 0 0 0 0  Restless 0 0 0 0  Easily annoyed or irritable 0 0 0 0  Afraid - awful might happen 0 0 0 0  Total GAD 7 Score 0 0 0 0  Anxiety Difficulty Not difficult at all Not difficult at all Not difficult at all Not difficult at all       12/14/2023    1:28 PM 09/07/2023   10:11 AM 04/09/2023   10:04 AM  Depression screen PHQ 2/9  Decreased Interest 0 0 0  Down, Depressed, Hopeless 0 0 0  PHQ - 2 Score 0 0 0  Altered sleeping  1   Tired, decreased energy  1   Change in appetite  2   Feeling bad or failure about yourself   0   Trouble concentrating  0   Moving slowly or fidgety/restless  0   Suicidal thoughts  0   PHQ-9 Score  4    Difficult doing work/chores  Not difficult at all      Data saved with a previous flowsheet row definition    BP Readings from  Last 3 Encounters:  01/18/24 138/80  01/11/24 90/68  12/14/23 122/84    Physical Exam  Wt Readings from Last 3 Encounters:  01/16/24 167 lb 15.9 oz (76.2 kg)  01/11/24 170 lb (77.1 kg)  12/14/23 170 lb (77.1 kg)    There were no vitals taken for this visit.  Assessment and Plan:  Problem List Items Addressed This Visit   None   No follow-ups on file.    [name], MD [facility] [place]

## 2024-01-20 NOTE — Telephone Encounter (Signed)
 PA request has been Received. New Encounter has been or will be created for follow up. For additional info see Pharmacy Prior Auth telephone encounter from 01/20/24.

## 2024-01-20 NOTE — Transitions of Care (Post Inpatient/ED Visit) (Signed)
 01/20/2024  Name: Sandra Murphy MRN: [id number] DOB: 1933-12-12  Today's TOC FU Call Status: Today's TOC FU Call Status:: Successful TOC FU Call Completed Unsuccessful Call (1st Attempt) Date: 01/19/24 Unsuccessful Call (2nd Attempt) Date: 01/19/24  Patient's Name and Date of Birth confirmed. Name, DOB  Transition Care Management Follow-up Telephone Call Date of Discharge: 01/16/24 Discharge Facility: [facility]) Type of Discharge: Emergency Department Reason for ED Visit: Other: How have you been since you were released from the hospital?: Better Any questions or concerns?: No  Items Reviewed: Did you receive and understand the discharge instructions provided?: Yes Medications obtained,verified, and reconciled?: Yes (Medications Reviewed) Any new allergies since your discharge?: No Dietary orders reviewed?: NA Do you have support at home?: Yes  Medications Reviewed Today: Medications Reviewed Today     Reviewed by Jaclyn Andy, Steva SAILOR, CMA (Certified Medical Assistant) on 01/20/24 at [id number]  Med List Status: <None>   Medication Order Taking? Sig Documenting Provider Last Dose Status Informant  alendronate  (FOSAMAX ) 70 MG tablet [id number]  Take 1 tablet by mouth once a week Justus Leita DEL, MD  Active Self  Cholecalciferol (VITAMIN D3) 250 MCG (10000 UT) CAPS [id number]  Take by mouth. [provider]  Active Self  clopidogrel  (PLAVIX ) 75 MG tablet [id number]  Take 1 tablet (75 mg total) by mouth daily. Lorene Lesley LITTIE, PA-C  Active Self  cyanocobalamin  1000 MCG tablet [id number]  Take 1 tablet (1,000 mcg total) by mouth daily. Von Bellis, MD  Active   Dextromethorphan -guaiFENesin  20-200 MG/20ML LIQD [id number]  Take 10 mLs by mouth every 4 (four) hours as needed for cough. Von Bellis, MD  Active   doxycycline  (VIBRA -TABS) 100 MG tablet [id number]  Take 1 tablet (100 mg total) by mouth 2 (two) times daily for 7 days. Justus Leita DEL, MD   Active Self  esomeprazole (NEXIUM) 20 MG capsule [id number]  Take 20 mg by mouth daily at 12 noon. [provider]  Active Self  guaiFENesin  (MUCINEX ) 600 MG 12 hr tablet [id number]  Take 1 tablet (600 mg total) by mouth 2 (two) times daily for 5 days. Von Bellis, MD  Active   levothyroxine  (SYNTHROID ) 50 MCG tablet [id number]  TAKE 1 TABLET BY MOUTH ONCE DAILY BEFORE BREAKFAST Berglund, Laura H, MD  Active Self  loratadine  (CLARITIN ) 10 MG tablet [id number]  Take 1 tablet (10 mg total) by mouth daily after supper for 10 days. Von Bellis, MD  Active   LORazepam  (ATIVAN ) 0.5 MG tablet [id number]  Take 1 tablet (0.5 mg total) by mouth 2 (two) times daily as needed. for anxiety Justus Leita DEL, MD  Active Self  mirtazapine  (REMERON ) 30 MG tablet [id number]  Take 1 tablet by mouth once daily Berglund, Laura H, MD  Active Self  NIFEdipine  (PROCARDIA -XL/NIFEDICAL-XL) 30 MG 24 hr tablet [id number]  TAKE ONE TABLET (30 MG) BY MOUTH ONCE DAILY. Gollan, Timothy J, MD  Active Self  nitroGLYCERIN (NITROSTAT) 0.4 MG SL tablet [id number]  Place under the tongue. [provider]  Active Self  oxybutynin  (DITROPAN -XL) 10 MG 24 hr tablet [id number]  Take 1 tablet by mouth once daily Berglund, Laura H, MD  Active Self  pantoprazole  (PROTONIX ) 40 MG tablet [id number]  Take 1 tablet by mouth twice daily Berglund, Laura H, MD  Active Self  pravastatin  (PRAVACHOL ) 40 MG tablet [id number]  Take 1 tablet (40 mg total) by mouth daily. Gollan, Timothy J, MD  Active Self  sacubitril -valsartan  (ENTRESTO ) 24-26 MG [id number]  Take 1 tablet by mouth twice daily Gollan, Timothy J, MD  Active Self  SOTALOL  AF 80 MG TABS [id number]  Take 1 tablet by mouth twice daily Gollan, Timothy J, MD  Active Self  spironolactone  (ALDACTONE ) 25 MG tablet [id number]  Take 0.5 tablets (12.5 mg total) by mouth daily.  Patient not taking: Reported on 01/17/2024   Lorene Lesley CROME, PA-C  Active Self  traMADol (ULTRAM) 50 MG tablet  [id number]  Take 50 mg by mouth every 6 (six) hours as needed. [provider]  Active Self            Home Care and Equipment/Supplies: Were Home Health Services Ordered?: NA Any new equipment or medical supplies ordered?: NA  Functional Questionnaire: Do you need assistance with bathing/showering or dressing?: No Do you need assistance with meal preparation?: No Do you need assistance with eating?: No Do you have difficulty maintaining continence: No Do you need assistance with getting out of bed/getting out of a chair/moving?: No Do you have difficulty managing or taking your medications?: No  Follow up appointments reviewed: PCP Follow-up appointment confirmed?: NA Specialist Hospital Follow-up appointment confirmed?: NA Do you need transportation to your follow-up appointment?: No Do you understand care options if your condition(s) worsen?: Yes-patient verbalized understanding    SIGNATURE Steva Britanni Yarde, CMA

## 2024-01-20 NOTE — Telephone Encounter (Signed)
 Pharmacy Patient Advocate Encounter   Received notification from Pt Calls Messages that prior authorization for LORazepam  0.5MG  tablets is required/requested.   Insurance verification completed.   The patient is insured through [employer].   Per test claim: PA required; PA started via CoverMyMeds. KEY [id number] . Waiting for clinical questions to populate.

## 2024-01-20 NOTE — Telephone Encounter (Signed)
 Please complete PA for Lorazepam .  KEY: [id number]  KP

## 2024-01-22 ENCOUNTER — Other Ambulatory Visit (HOSPITAL_COMMUNITY): Payer: Self-pay

## 2024-01-22 NOTE — Telephone Encounter (Signed)
 Pharmacy Patient Advocate Encounter  Received notification from [employer] that Prior Authorization for LORazepam  0.5MG  tablets has been APPROVED from 01/20/2024 to 02/23/2025. Unable to obtain price due to refill too soon rejection, last fill date 01/20/2024 next available fill date12/19/2025.   PA #/Case ID/Reference #: [id number]

## 2024-01-25 NOTE — Telephone Encounter (Signed)
Sent pt message on Mychart.  KP

## 2024-01-25 NOTE — Telephone Encounter (Signed)
 Received notification from [employer] that Prior Authorization for LORazepam  0.5MG  tablets has been APPROVED from 01/20/2024 to 02/23/2025. Unable to obtain price due to refill too soon rejection, last fill date 01/20/2024 next available fill date12/19/2025.

## 2024-01-26 ENCOUNTER — Other Ambulatory Visit: Payer: Self-pay | Admitting: Internal Medicine

## 2024-01-26 DIAGNOSIS — M810 Age-related osteoporosis without current pathological fracture: Secondary | ICD-10-CM

## 2024-01-26 NOTE — Telephone Encounter (Unsigned)
 Copied from CRM #[id number]. Topic: Clinical - Medication Refill >> Jan 26, 2024  9:13 AM Cynthia K wrote: Medication:  alendronate  (FOSAMAX ) 70 MG tablet  Has the patient contacted their pharmacy? Yes (Agent: If no, request that the patient contact the pharmacy for the refill. If patient does not wish to contact the pharmacy document the reason why and proceed with request.) (Agent: If yes, when and what did the pharmacy advise?) Pharmacy needs order to refill  This is the patient's preferred pharmacy:  [facility] [address], [place] - [address] [place] [address] Phone: [phone] Fax: [phone]  Is this the correct pharmacy for this prescription? Yes If no, delete pharmacy and type the correct one.   Has the prescription been filled recently? No  Is the patient out of the medication? Yes - She is going out of town tomorrow and needs refill before she goes. She will be gone for 1 month  Has the patient been seen for an appointment in the last year OR does the patient have an upcoming appointment? Yes  Can we respond through MyChart? No  Agent: Please be advised that Rx refills may take up to 3 business days. We ask that you follow-up with your pharmacy.

## 2024-01-26 NOTE — Telephone Encounter (Signed)
 Copied from CRM #[id number]. Topic: Clinical - Medication Refill >> Jan 26, 2024  9:13 AM Cynthia K wrote: Medication:  alendronate  (FOSAMAX ) 70 MG tablet  Has the patient contacted their pharmacy? Yes (Agent: If no, request that the patient contact the pharmacy for the refill. If patient does not wish to contact the pharmacy document the reason why and proceed with request.) (Agent: If yes, when and what did the pharmacy advise?) Pharmacy needs order to refill  This is the patient's preferred pharmacy:  [facility] [address] [place] - [address] [place] [address] Phone: [phone] Fax: [phone]  Is this the correct pharmacy for this prescription? Yes If no, delete pharmacy and type the correct one.   Has the prescription been filled recently? No  Is the patient out of the medication? Yes - She is going out of town tomorrow and needs refill before she goes. She will be gone for 1 month  Has the patient been seen for an appointment in the last year OR does the patient have an upcoming appointment? Yes  Can we respond through MyChart? No  Agent: Please be advised that Rx refills may take up to 3 business days. We ask that you follow-up with your pharmacy. >> Jan 26, 2024  9:17 AM Montie POUR wrote: Harlie and daughter will call back after Christmas to schedule with new doctor. They are going out of town for 1 month.

## 2024-01-28 MED ORDER — ALENDRONATE SODIUM 70 MG PO TABS: 70.0000 mg | ORAL_TABLET | ORAL | 0 refills | Status: AC

## 2024-01-28 NOTE — Telephone Encounter (Signed)
 Pt's daughter called checking the status of this request, please advise.

## 2024-01-28 NOTE — Telephone Encounter (Signed)
 Requested Prescriptions  Refused Prescriptions Disp Refills   alendronate  (FOSAMAX ) 70 MG tablet [Pharmacy Med Name: Alendronate  Sodium 70 MG Oral Tablet] 12 tablet 0    Sig: Take 1 tablet by mouth once a week     Endocrinology:  Bisphosphonates Failed - 01/28/2024  5:14 PM      Failed - Ca in normal range and within 360 days    Calcium  Date Value Ref Range Status  01/18/2024 8.3 (L) 8.9 - 10.3 mg/dL Final         Passed - Vitamin D  in normal range and within 360 days    Vit D, 25-Hydroxy  Date Value Ref Range Status  01/17/2024 64.16 30 - 100 ng/mL Final    Comment:    (NOTE) Vitamin D  deficiency has been defined by the Institute of Medicine  and an Endocrine Society practice guideline as a level of serum 25-OH  vitamin D  less than 20 ng/mL (1,2). The Endocrine Society went on to  further define vitamin D  insufficiency as a level between 21 and 29  ng/mL (2).  1. IOM (Institute of Medicine). 2010. Dietary reference intakes for  calcium and D. [place]: [employer]. 2. Holick MF, Binkley Gilberts, Bischoff-Ferrari HA, et al. Evaluation,  treatment, and prevention of vitamin D  deficiency: an Endocrine  Society clinical practice guideline, JCEM. 2011 Jul; 96(7): 1911-30.  Performed at [facility], [address] [place], [place] [address]          Passed - Cr in normal range and within 360 days    Creatinine, Ser  Date Value Ref Range Status  01/18/2024 0.91 0.44 - 1.00 mg/dL Final         Passed - Mg Level in normal range and within 360 days    Magnesium   Date Value Ref Range Status  01/18/2024 2.4 1.7 - 2.4 mg/dL Final    Comment:    Performed at [facility], [address], [place], [place] [address]         Passed - Phosphate in normal range and within 360 days    Phosphorus  Date Value Ref Range Status  01/18/2024 3.1 2.5 - 4.6 mg/dL Final    Comment:    Performed at [facility], [address] [place],  [place] [address]         Passed - eGFR is 30 or above and within 360 days    GFR calc Af [place]  Date Value Ref Range Status  01/22/2016 >60 >60 mL/min Final    Comment:    (NOTE) The eGFR has been calculated using the CKD EPI equation. This calculation has not been validated in all clinical situations. eGFR's persistently <60 mL/min signify possible Chronic Kidney Disease.    GFR, Estimated  Date Value Ref Range Status  01/18/2024 >60 >60 mL/min Final    Comment:    (NOTE) Calculated using the CKD-EPI Creatinine Equation (2021)    eGFR  Date Value Ref Range Status  12/14/2023 62 >59 mL/min/1.73 Final         Passed - Valid encounter within last 12 months    Recent Outpatient Visits           2 weeks ago Fever, unspecified fever cause   [place] [facility], MD   1 month ago Sinus congestion   [facility] at [facility], Leita DEL,  MD   4 months ago Dysuria   [facility] at [facility], Leita DEL, MD   9 months ago Generalized anxiety disorder   [facility] at [facility], Leita DEL, MD              Passed - Bone Mineral Density or Dexa Scan completed in the last 2 years

## 2024-02-19 ENCOUNTER — Ambulatory Visit: Payer: Medicare HMO

## 2024-02-19 DIAGNOSIS — I495 Sick sinus syndrome: Secondary | ICD-10-CM

## 2024-02-21 ENCOUNTER — Ambulatory Visit: Payer: Self-pay | Admitting: Cardiology

## 2024-02-21 LAB — CUP PACEART REMOTE DEVICE CHECK
Battery Remaining Longevity: 18 mo
Battery Remaining Percentage: 30 %
Brady Statistic RA Percent Paced: 78 %
Brady Statistic RV Percent Paced: 5 %
Date Time Interrogation Session: 20251226015100
Implantable Lead Connection Status: 753985
Implantable Lead Connection Status: 753985
Implantable Lead Implant Date: 20180913
Implantable Lead Implant Date: 20180913
Implantable Lead Location: 753859
Implantable Lead Location: 753860
Implantable Lead Model: 4473
Implantable Lead Model: 7742
Implantable Lead Serial Number: 497240
Implantable Lead Serial Number: 841204
Implantable Pulse Generator Implant Date: 20180913
Lead Channel Impedance Value: 378 Ohm
Lead Channel Impedance Value: 887 Ohm
Lead Channel Pacing Threshold Amplitude: 0.4 V
Lead Channel Pacing Threshold Amplitude: 1 V
Lead Channel Pacing Threshold Pulse Width: 0.4 ms
Lead Channel Pacing Threshold Pulse Width: 0.4 ms
Lead Channel Setting Pacing Amplitude: 2 V
Lead Channel Setting Pacing Amplitude: 2 V
Lead Channel Setting Pacing Pulse Width: 0.4 ms
Lead Channel Setting Sensing Sensitivity: 2.5 mV
Pulse Gen Serial Number: 374129
Zone Setting Status: 755011

## 2024-02-22 ENCOUNTER — Other Ambulatory Visit: Payer: Self-pay | Admitting: Internal Medicine

## 2024-02-22 ENCOUNTER — Telehealth: Payer: Self-pay

## 2024-02-22 DIAGNOSIS — F411 Generalized anxiety disorder: Secondary | ICD-10-CM

## 2024-02-22 MED ORDER — LORAZEPAM 0.5 MG PO TABS: 0.5000 mg | ORAL_TABLET | Freq: Two times a day (BID) | ORAL | 0 refills | Status: DC | PRN

## 2024-02-22 NOTE — Telephone Encounter (Signed)
 Copied from CRM #[id number]. Topic: Clinical - Medication Refill >> Feb 22, 2024  9:39 AM Charolett L wrote: Medication: LORazepam  (ATIVAN ) 0.5 MG tablet  Has the patient contacted their pharmacy? Yes (Agent: If no, request that the patient contact the pharmacy for the refill. If patient does not wish to contact the pharmacy document the reason why and proceed with request.) (Agent: If yes, when and what did the pharmacy advise?)  This is the patient's preferred pharmacy:    [facility] [address], [place] - [address] [place] [address] Phone: [phone] Fax: [phone]   Is this the correct pharmacy for this prescription? Yes If no, delete pharmacy and type the correct one.   Has the prescription been filled recently? Yes  Is the patient out of the medication? Yes  Has the patient been seen for an appointment in the last year OR does the patient have an upcoming appointment? Yes  Can we respond through MyChart? Yes  Agent: Please be advised that Rx refills may take up to 3 business days. We ask that you follow-up with your pharmacy. >> Feb 22, 2024  3:06 PM Antony RAMAN wrote: Patients daughter calling back to see if this is ready yet since someone said they would put it was high priority since the patient is out of the med [phone]

## 2024-02-22 NOTE — Telephone Encounter (Unsigned)
 Copied from CRM #[id number]. Topic: Clinical - Medication Refill >> Feb 22, 2024  9:39 AM Charolett L wrote: Medication: LORazepam  (ATIVAN ) 0.5 MG tablet  Has the patient contacted their pharmacy? Yes (Agent: If no, request that the patient contact the pharmacy for the refill. If patient does not wish to contact the pharmacy document the reason why and proceed with request.) (Agent: If yes, when and what did the pharmacy advise?)  This is the patient's preferred pharmacy:    [facility] [address], [place] - [address] [place] [address] Phone: [phone] Fax: [phone]   Is this the correct pharmacy for this prescription? Yes If no, delete pharmacy and type the correct one.   Has the prescription been filled recently? Yes  Is the patient out of the medication? Yes  Has the patient been seen for an appointment in the last year OR does the patient have an upcoming appointment? Yes  Can we respond through MyChart? Yes  Agent: Please be advised that Rx refills may take up to 3 business days. We ask that you follow-up with your pharmacy.

## 2024-02-22 NOTE — Progress Notes (Unsigned)
 "   Date:  02/22/2024   Name:  Sandra Murphy   DOB:  [birth year]   MRN:  [id number]   Chief Complaint: No chief complaint on file.  HPI  Review of Systems   Lab Results  Component Value Date   NA 143 01/18/2024   K 4.0 01/18/2024   CO2 25 01/18/2024   GLUCOSE 95 01/18/2024   BUN 11 01/18/2024   CREATININE 0.91 01/18/2024   CALCIUM 8.3 (L) 01/18/2024   EGFR 62 12/14/2023   GFRNONAA >60 01/18/2024   Lab Results  Component Value Date   CHOL 173 10/08/2022   HDL 52 10/08/2022   LDLCALC 85 10/08/2022   TRIG 219 (H) 10/08/2022   CHOLHDL 3.3 10/08/2022   Lab Results  Component Value Date   TSH 0.802 01/16/2024   No results found for: HGBA1C Lab Results  Component Value Date   WBC 5.1 01/18/2024   HGB 11.4 (L) 01/18/2024   HCT 34.7 (L) 01/18/2024   MCV 92.0 01/18/2024   PLT 215 01/18/2024   Lab Results  Component Value Date   ALT 7 01/17/2024   AST 18 01/17/2024   ALKPHOS 62 01/17/2024   BILITOT 0.5 01/17/2024   Lab Results  Component Value Date   VD25OH 64.16 01/17/2024     Patient Active Problem List   Diagnosis Date Noted   Malaise and fatigue 01/16/2024   Chest pain 01/11/2024   Fever 01/11/2024   Essential hypertension 10/08/2022   Tachy-brady syndrome (HCC) 12/04/2021   GERD without esophagitis 12/04/2021   Generalized anxiety disorder 12/04/2021   Degenerative disc disease, lumbar 12/04/2021   OAB (overactive bladder) 12/04/2021   Acquired hypothyroidism 12/04/2021   Mixed hyperlipidemia 08/08/2021   Hx of deep venous thrombosis 08/08/2021   Osteoporosis 08/08/2021   PVD (peripheral vascular disease) 08/08/2021   Hx of heart artery stent 08/08/2021   Hiatal hernia 08/08/2021   Asthma 11/14/2020   Atherosclerotic heart disease of native coronary artery without angina pectoris 01/10/2016   PVCs (premature ventricular contractions) 01/10/2016    Allergies[1]  Past Surgical History:  Procedure Laterality Date   ABDOMINAL HYSTERECTOMY      ANTERIOR FUSION CERVICAL SPINE     twice   CHOLECYSTECTOMY     CORONARY ANGIOPLASTY WITH STENT PLACEMENT     proximal LAD and mid RCA   LUMBAR DISC SURGERY     Pace maker  11/06/2016   Medtronic Boston Scietific Pacemaker Model 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  Active Medications[3]     12/14/2023    1:28 PM 09/07/2023   10:12 AM 04/09/2023   10:04 AM 10/08/2022   10:02 AM  GAD 7 : Generalized Anxiety Score  Nervous, Anxious, on Edge 0 0 0 0  Control/stop worrying 0 0 0 0  Worry too much - different things 0 0 0 0  Trouble relaxing 0 0 0 0  Restless 0 0 0 0  Easily annoyed or irritable 0 0 0 0  Afraid - awful might happen 0 0 0 0  Total GAD 7 Score 0 0 0 0  Anxiety Difficulty Not difficult at all Not difficult at all Not difficult at all Not difficult at all       12/14/2023    1:28 PM 09/07/2023   10:11 AM 04/09/2023   10:04 AM  Depression screen PHQ 2/9  Decreased Interest 0 0 0  Down,  Depressed, Hopeless 0 0 0  PHQ - 2 Score 0 0 0  Altered sleeping  1   Tired, decreased energy  1   Change in appetite  2   Feeling bad or failure about yourself   0   Trouble concentrating  0   Moving slowly or fidgety/restless  0   Suicidal thoughts  0   PHQ-9 Score  4    Difficult doing work/chores  Not difficult at all      Data saved with a previous flowsheet row definition    BP Readings from Last 3 Encounters:  01/18/24 138/80  01/11/24 90/68  12/14/23 122/84    Physical Exam  Wt Readings from Last 3 Encounters:  01/16/24 167 lb 15.9 oz (76.2 kg)  01/11/24 170 lb (77.1 kg)  12/14/23 170 lb (77.1 kg)    There were no vitals taken for this visit.  Assessment and Plan:  Problem List Items Addressed This Visit   None   No follow-ups on file.    Sandra HILARIO Adie, MD [place] [facility] [place]           [1]  Allergies Allergen Reactions    Penicillins Hives and Other (See Comments)   Sulfur Dioxide Nausea Only and Other (See Comments)   Other Other (See Comments)   Sulfa Antibiotics Nausea Only, Other (See Comments) and Diarrhea    HEADACHE   [2]  Social History Tobacco Use   Smoking status: Never   Smokeless tobacco: Never  Vaping Use   Vaping status: Never Used  Substance Use Topics   Alcohol use: No   Drug use: Never  [3]  No outpatient medications have been marked as taking for the 02/22/24 encounter (Orders Only) with Murphy Sandra DEL, MD.   "

## 2024-02-22 NOTE — Progress Notes (Signed)
 Remote PPM Transmission

## 2024-02-23 NOTE — Telephone Encounter (Signed)
 Patient is out of town she's set for January 20th to see dr sol.

## 2024-02-23 NOTE — Telephone Encounter (Signed)
 Please review. Dr. Alison message.  KP

## 2024-02-23 NOTE — Telephone Encounter (Signed)
 Requested medications are due for refill today.  no  Requested medications are on the active medications list.  yes  Last refill. 02/22/2024 #60 0   Future visit scheduled.   yes  Notes to clinic.  Refusal not delegated.    Requested Prescriptions  Pending Prescriptions Disp Refills   LORazepam  (ATIVAN ) 0.5 MG tablet [Pharmacy Med Name: LORazepam  0.5 MG Oral Tablet] 60 tablet 0    Sig: Take 1 tablet by mouth twice daily as needed for anxiety     Not Delegated - Psychiatry: Anxiolytics/Hypnotics 2 Failed - 02/23/2024  5:32 PM      Failed - This refill cannot be delegated      Failed - Urine Drug Screen completed in last 360 days      Passed - Patient is not pregnant      Passed - Valid encounter within last 6 months    Recent Outpatient Visits           1 month ago Fever, unspecified fever cause   [place] [facility] at [facility], MD   2 months ago Sinus congestion   [place] [facility] at [facility], Leita DEL, MD   5 months ago Dysuria   [facility] at [facility], Leita DEL, MD   10 months ago Generalized anxiety disorder   [facility] at [facility], Leita DEL, MD

## 2024-02-28 ENCOUNTER — Other Ambulatory Visit: Payer: Self-pay | Admitting: Internal Medicine

## 2024-02-28 DIAGNOSIS — K219 Gastro-esophageal reflux disease without esophagitis: Secondary | ICD-10-CM

## 2024-03-01 NOTE — Telephone Encounter (Signed)
 Requested Prescriptions  Pending Prescriptions Disp Refills   pantoprazole  (PROTONIX ) 40 MG tablet [Pharmacy Med Name: Pantoprazole  Sodium 40 MG Oral Tablet Delayed Release] 180 tablet 0    Sig: Take 1 tablet by mouth twice daily     Gastroenterology: Proton Pump Inhibitors Passed - 03/01/2024 10:10 AM      Passed - Valid encounter within last 12 months    Recent Outpatient Visits           1 month ago Fever, unspecified fever cause   [place] [facility] at [facility], MD   2 months ago Sinus congestion   [place] [facility] at [facility], Leita DEL, MD   5 months ago Dysuria   [facility] at [facility], Leita DEL, MD   10 months ago Generalized anxiety disorder   [facility] at [facility], Leita DEL, MD

## 2024-03-07 ENCOUNTER — Other Ambulatory Visit: Payer: Self-pay

## 2024-03-07 MED ORDER — LEVOTHYROXINE SODIUM 50 MCG PO TABS: 50.0000 ug | ORAL_TABLET | Freq: Every day | ORAL | 0 refills | Status: AC

## 2024-03-08 ENCOUNTER — Telehealth: Payer: Self-pay

## 2024-03-08 NOTE — Telephone Encounter (Signed)
 Copied from CRM [id number]. Topic: Appointments - Transfer of Care >> Mar 08, 2024  9:07 AM Amy B wrote: Pt is requesting to transfer FROM: Leita Nani Pt is requesting to transfer TO: Harlene Saddler Reason for requested transfer: provider left practice It is the responsibility of the team the patient would like to transfer to (Dr. Harlene Saddler) to reach out to the patient if for any reason this transfer is not acceptable.

## 2024-03-15 ENCOUNTER — Encounter: Admitting: Family Medicine

## 2024-03-23 ENCOUNTER — Ambulatory Visit: Admitting: Cardiology

## 2024-03-24 ENCOUNTER — Other Ambulatory Visit: Payer: Self-pay | Admitting: Internal Medicine

## 2024-03-24 DIAGNOSIS — F411 Generalized anxiety disorder: Secondary | ICD-10-CM

## 2024-03-24 MED ORDER — LORAZEPAM 0.5 MG PO TABS: 0.5000 mg | ORAL_TABLET | Freq: Two times a day (BID) | ORAL | 0 refills | Status: AC | PRN

## 2024-03-24 NOTE — Telephone Encounter (Signed)
 Copied from CRM #[id number]. Topic: Clinical - Medication Refill >> Mar 24, 2024  9:29 AM Suzen RAMAN wrote: Medication: LORazepam  (ATIVAN ) 0.5 MG tablet   Has the patient contacted their pharmacy? Yes  This is the patient's preferred pharmacy:  [facility] [address], [place] - [address] [place] [address] Phone: [phone] Fax: [phone]   Is this the correct pharmacy for this prescription? Yes If no, delete pharmacy and type the correct one.   Has the prescription been filled recently? No  Is the patient out of the medication? No  Has the patient been seen for an appointment in the last year OR does the patient have an upcoming appointment? Yes  Can we respond through MyChart? Yes  Agent: Please be advised that Rx refills may take up to 3 business days. We ask that you follow-up with your pharmacy.

## 2024-04-27 ENCOUNTER — Ambulatory Visit: Admitting: Cardiology

## 2024-06-08 ENCOUNTER — Encounter: Admitting: Student
